# Patient Record
Sex: Male | Born: 1960 | Race: White | Hispanic: No | Marital: Married | Smoking: Never smoker
Health system: Southern US, Community
[De-identification: ages and names within clinical notes are randomized; demographics above are authoritative.]

## PROBLEM LIST (undated history)

## (undated) DIAGNOSIS — E538 Deficiency of other specified B group vitamins: Secondary | ICD-10-CM

## (undated) DIAGNOSIS — E559 Vitamin D deficiency, unspecified: Secondary | ICD-10-CM

## (undated) DIAGNOSIS — E119 Type 2 diabetes mellitus without complications: Secondary | ICD-10-CM

## (undated) HISTORY — DX: Vitamin D deficiency, unspecified: E55.9

## (undated) HISTORY — DX: Type 2 diabetes mellitus without complications: E11.9

## (undated) HISTORY — PX: CHOLECYSTECTOMY: SHX55

## (undated) HISTORY — DX: Deficiency of other specified B group vitamins: E53.8

---

## 2006-12-28 ENCOUNTER — Ambulatory Visit: Payer: Self-pay | Admitting: Internal Medicine

## 2007-01-10 ENCOUNTER — Ambulatory Visit: Payer: Self-pay | Admitting: Internal Medicine

## 2007-01-27 ENCOUNTER — Ambulatory Visit: Payer: Self-pay | Admitting: Internal Medicine

## 2007-02-27 ENCOUNTER — Ambulatory Visit: Payer: Self-pay | Admitting: Internal Medicine

## 2007-03-30 ENCOUNTER — Ambulatory Visit: Payer: Self-pay | Admitting: Internal Medicine

## 2007-04-27 ENCOUNTER — Ambulatory Visit: Payer: Self-pay | Admitting: Internal Medicine

## 2007-05-28 ENCOUNTER — Ambulatory Visit: Payer: Self-pay | Admitting: Internal Medicine

## 2007-07-28 ENCOUNTER — Ambulatory Visit: Payer: Self-pay | Admitting: Internal Medicine

## 2007-08-08 ENCOUNTER — Ambulatory Visit: Payer: Self-pay | Admitting: Internal Medicine

## 2007-08-27 ENCOUNTER — Ambulatory Visit: Payer: Self-pay | Admitting: Internal Medicine

## 2007-10-17 ENCOUNTER — Ambulatory Visit: Payer: Self-pay | Admitting: Internal Medicine

## 2007-10-28 ENCOUNTER — Ambulatory Visit: Payer: Self-pay | Admitting: Internal Medicine

## 2007-11-27 ENCOUNTER — Ambulatory Visit: Payer: Self-pay | Admitting: Internal Medicine

## 2007-12-28 ENCOUNTER — Ambulatory Visit: Payer: Self-pay | Admitting: Internal Medicine

## 2008-01-15 ENCOUNTER — Ambulatory Visit: Payer: Self-pay | Admitting: Internal Medicine

## 2008-01-27 ENCOUNTER — Ambulatory Visit: Payer: Self-pay | Admitting: Internal Medicine

## 2008-02-27 ENCOUNTER — Ambulatory Visit: Payer: Self-pay | Admitting: Internal Medicine

## 2008-07-27 ENCOUNTER — Ambulatory Visit: Payer: Self-pay | Admitting: Internal Medicine

## 2008-08-17 ENCOUNTER — Ambulatory Visit: Payer: Self-pay | Admitting: Internal Medicine

## 2008-08-26 ENCOUNTER — Ambulatory Visit: Payer: Self-pay | Admitting: Internal Medicine

## 2008-10-27 ENCOUNTER — Ambulatory Visit: Payer: Self-pay | Admitting: Internal Medicine

## 2008-11-16 ENCOUNTER — Ambulatory Visit: Payer: Self-pay | Admitting: Internal Medicine

## 2008-11-26 ENCOUNTER — Ambulatory Visit: Payer: Self-pay | Admitting: Internal Medicine

## 2008-12-31 ENCOUNTER — Ambulatory Visit: Payer: Self-pay | Admitting: General Surgery

## 2009-01-06 ENCOUNTER — Ambulatory Visit: Payer: Self-pay | Admitting: General Surgery

## 2009-02-26 ENCOUNTER — Ambulatory Visit: Payer: Self-pay | Admitting: Internal Medicine

## 2009-03-01 ENCOUNTER — Ambulatory Visit: Payer: Self-pay | Admitting: Internal Medicine

## 2009-03-02 ENCOUNTER — Ambulatory Visit: Payer: Self-pay | Admitting: Internal Medicine

## 2009-03-29 ENCOUNTER — Ambulatory Visit: Payer: Self-pay | Admitting: Internal Medicine

## 2009-07-18 ENCOUNTER — Ambulatory Visit: Payer: Self-pay | Admitting: Internal Medicine

## 2009-07-27 ENCOUNTER — Ambulatory Visit: Payer: Self-pay | Admitting: Internal Medicine

## 2009-08-25 ENCOUNTER — Ambulatory Visit: Payer: Self-pay | Admitting: Internal Medicine

## 2009-08-26 ENCOUNTER — Ambulatory Visit: Payer: Self-pay | Admitting: Internal Medicine

## 2010-03-01 ENCOUNTER — Ambulatory Visit: Payer: Self-pay | Admitting: Internal Medicine

## 2010-03-03 ENCOUNTER — Ambulatory Visit: Payer: Self-pay | Admitting: Internal Medicine

## 2010-03-29 ENCOUNTER — Ambulatory Visit: Payer: Self-pay | Admitting: Internal Medicine

## 2010-09-01 ENCOUNTER — Ambulatory Visit: Payer: Self-pay | Admitting: Internal Medicine

## 2010-09-27 ENCOUNTER — Ambulatory Visit: Payer: Self-pay | Admitting: Internal Medicine

## 2011-03-02 ENCOUNTER — Ambulatory Visit: Payer: Self-pay | Admitting: Internal Medicine

## 2011-03-06 ENCOUNTER — Ambulatory Visit: Payer: Self-pay | Admitting: Internal Medicine

## 2011-03-06 LAB — CBC CANCER CENTER
Basophil %: 0.5 %
Eosinophil #: 0.1 x10 3/mm (ref 0.0–0.7)
HCT: 41.5 % (ref 40.0–52.0)
Lymphocyte %: 24.6 %
MCH: 29.9 pg (ref 26.0–34.0)
MCHC: 34.9 g/dL (ref 32.0–36.0)
Monocyte #: 0.4 x10 3/mm (ref 0.0–0.7)
Neutrophil #: 4.2 x10 3/mm (ref 1.4–6.5)
Neutrophil %: 67.7 %
Platelet: 125 x10 3/mm — ABNORMAL LOW (ref 150–440)
RDW: 13.9 % (ref 11.5–14.5)
WBC: 6.2 x10 3/mm (ref 3.8–10.6)

## 2011-03-06 LAB — LACTATE DEHYDROGENASE: LDH: 171 U/L (ref 87–241)

## 2011-03-30 ENCOUNTER — Ambulatory Visit: Payer: Self-pay | Admitting: Internal Medicine

## 2011-09-11 ENCOUNTER — Ambulatory Visit: Payer: Self-pay | Admitting: Oncology

## 2011-09-27 ENCOUNTER — Ambulatory Visit: Payer: Self-pay | Admitting: Oncology

## 2012-09-11 ENCOUNTER — Ambulatory Visit: Payer: Self-pay | Admitting: Oncology

## 2012-09-26 ENCOUNTER — Ambulatory Visit: Payer: Self-pay | Admitting: Oncology

## 2012-12-12 ENCOUNTER — Ambulatory Visit: Payer: Self-pay | Admitting: Oncology

## 2012-12-27 ENCOUNTER — Ambulatory Visit: Payer: Self-pay | Admitting: Oncology

## 2013-06-22 ENCOUNTER — Ambulatory Visit: Payer: Self-pay | Admitting: Oncology

## 2013-08-25 DIAGNOSIS — E782 Mixed hyperlipidemia: Secondary | ICD-10-CM | POA: Insufficient documentation

## 2013-08-25 DIAGNOSIS — E538 Deficiency of other specified B group vitamins: Secondary | ICD-10-CM | POA: Insufficient documentation

## 2013-08-25 DIAGNOSIS — E559 Vitamin D deficiency, unspecified: Secondary | ICD-10-CM | POA: Insufficient documentation

## 2013-12-23 ENCOUNTER — Ambulatory Visit: Payer: Self-pay | Admitting: Oncology

## 2013-12-29 DIAGNOSIS — E119 Type 2 diabetes mellitus without complications: Secondary | ICD-10-CM | POA: Insufficient documentation

## 2013-12-30 ENCOUNTER — Ambulatory Visit: Payer: Self-pay | Admitting: Oncology

## 2014-01-26 ENCOUNTER — Ambulatory Visit: Payer: Self-pay | Admitting: Oncology

## 2014-06-11 ENCOUNTER — Other Ambulatory Visit: Payer: Self-pay | Admitting: Oncology

## 2014-06-11 DIAGNOSIS — R161 Splenomegaly, not elsewhere classified: Secondary | ICD-10-CM

## 2014-10-06 ENCOUNTER — Other Ambulatory Visit: Payer: Self-pay | Admitting: Oncology

## 2014-12-30 ENCOUNTER — Telehealth: Payer: Self-pay | Admitting: Oncology

## 2014-12-31 ENCOUNTER — Telehealth: Payer: Self-pay | Admitting: *Deleted

## 2014-12-31 NOTE — Telephone Encounter (Signed)
Asking an order be faxed to [facility] [phone] for labs he needs drawn for his upcoming appt

## 2014-12-31 NOTE — Telephone Encounter (Signed)
Order faxed.

## 2015-01-12 ENCOUNTER — Ambulatory Visit: Payer: Self-pay | Admitting: Oncology

## 2015-01-14 ENCOUNTER — Other Ambulatory Visit: Payer: Self-pay | Admitting: *Deleted

## 2015-01-14 DIAGNOSIS — R161 Splenomegaly, not elsewhere classified: Secondary | ICD-10-CM

## 2015-01-17 ENCOUNTER — Ambulatory Visit: Payer: BLUE CROSS/BLUE SHIELD

## 2015-01-17 ENCOUNTER — Ambulatory Visit
Admission: RE | Admit: 2015-01-17 | Discharge: 2015-01-17 | Disposition: A | Discharge status: Home / Self Care | Payer: BLUE CROSS/BLUE SHIELD | Source: Ambulatory Visit | Attending: Family Medicine | Admitting: Family Medicine

## 2015-01-17 DIAGNOSIS — R161 Splenomegaly, not elsewhere classified: Secondary | ICD-10-CM | POA: Diagnosis not present

## 2015-01-24 ENCOUNTER — Encounter: Payer: Self-pay | Admitting: Oncology

## 2015-01-24 ENCOUNTER — Inpatient Hospital Stay: Payer: BLUE CROSS/BLUE SHIELD | Attending: Oncology | Admitting: Oncology

## 2015-01-24 VITALS — BP 137/89 | HR 91 | Temp 96.8°F | Wt 211.2 lb

## 2015-01-24 DIAGNOSIS — E119 Type 2 diabetes mellitus without complications: Secondary | ICD-10-CM | POA: Diagnosis not present

## 2015-01-24 DIAGNOSIS — R531 Weakness: Secondary | ICD-10-CM | POA: Insufficient documentation

## 2015-01-24 DIAGNOSIS — R160 Hepatomegaly, not elsewhere classified: Secondary | ICD-10-CM

## 2015-01-24 DIAGNOSIS — R5383 Other fatigue: Secondary | ICD-10-CM | POA: Diagnosis not present

## 2015-01-24 DIAGNOSIS — Z87891 Personal history of nicotine dependence: Secondary | ICD-10-CM | POA: Insufficient documentation

## 2015-01-24 DIAGNOSIS — K760 Fatty (change of) liver, not elsewhere classified: Secondary | ICD-10-CM | POA: Diagnosis not present

## 2015-01-24 DIAGNOSIS — Z794 Long term (current) use of insulin: Secondary | ICD-10-CM | POA: Diagnosis not present

## 2015-01-24 DIAGNOSIS — R161 Splenomegaly, not elsewhere classified: Secondary | ICD-10-CM | POA: Diagnosis not present

## 2015-01-24 DIAGNOSIS — D580 Hereditary spherocytosis: Secondary | ICD-10-CM | POA: Diagnosis not present

## 2015-01-24 MED ORDER — CYANOCOBALAMIN ER 1000 MCG PO TBCR: 1.0000 | EXTENDED_RELEASE_TABLET | Freq: Two times a day (BID) | ORAL | Status: AC

## 2015-01-24 MED ORDER — VITAMIN D3 25 MCG (1000 UT) PO CAPS: 1.0000 | ORAL_CAPSULE | Freq: Two times a day (BID) | ORAL | Status: AC

## 2015-01-24 NOTE — Progress Notes (Signed)
Patient here today for one year follow up and US results.  Patient complains of fatigue.  States he has a cold at this time.

## 2015-01-24 NOTE — Progress Notes (Signed)
[facility] @ [facility] Telephone:(336) [phone]  Fax:(336) [phone]     Gary Axononald D Naugle Jr. OB: 10/01/1960  MR#: [id number]  AOZ#:[id number]#:[id number]  Patient Care Team: Jerl MinaJames Hedrick, MD as PCP - General (Family Medicine)  CHIEF COMPLAINT: No chief complaint on file.    No history exists.    Herediatary Spherocytosis 1. Thrombocytopenia. 2. Ultrasound of abdomen general survey on 01-14-07. Cholelithiasis, probable fatty infiltration of liver, splenomegaly  3. CT abdomen and pelvis with contrast 01-24-07. Borderline splenomegaly. The portal vein is patent. No varices are noted. No evidence of retroperitoneal adenopathy. 4. Labs 01-10-07. HIV negative, HCV antibody negative, hepatitis-B antigen and antibody negative, ANA 42 negative, folic acid and vitamin B12 within normal limits, SPEP normal IFE pattern. Normal levels of Immunoglobulins. Total bilirubin 2.3, direct bilirubin 0.2, LDH 143 Coombs direct and indirect negative. 5. Labs 02-10-07. Platelet antibody, direct negative, platelet antibody indirect negative, super panel EBV acute antibody. EBV early antigen antibody, IgG 164, EBV antibody VCA, IgG 2507, EBV nuclear antigen antibody, IgG 458, EBV antibody VCA, IgM  6.  PNH screen 02-13-07. No flow cytometric evidence of PNH.   7. Ultrasound of groin 04-01-07. Four discrete lymph nodes on left with maximal size 2.0 cm. 8. Haptoglobin < 1 9. 11/24/07: osmotic fragility  and patient's red blood cells show slightly increased osmotic fragility.  Peripheral blood smear reveals rare spherocytes. 10/ 03/01/09: ultrasound spleen:Persistent splenomegaly, stable as compared to a prior exam of February 16, 2008 INTERVAL HISTORY: Patient is here for further follow-up regarding splenomegaly.  Patient has insulin-dependent diabetes.  Had ultrasound of abdomen which revealed normal size of the spleen. Patient continues to have intermittent weakness.  Blood sugar is very high at 361.  Diabetes being managed by  endocrinologist REVIEW OF SYSTEMS:    general status: Patient is feeling intermittent weakness  and tired.  No change in a performance status.  No chills.  No fever. HEENT: Alopecia.  No evidence of stomatitis Lungs: No cough or shortness of breath Cardiac: No chest pain or paroxysmal nocturnal dyspnea GI: No nausea no vomiting no diarrhea no abdominal pain Skin: No rash Lower extremity no swelling Neurological system: No tingling.  No numbness.  No other focal signs Musculoskeletal system no bony pains  As per HPI. Otherwise, a complete review of systems is negatve.   ADVANCED DIRECTIVES:  Patient does have advance healthcare directive, Patient   does not desire to make any changes HEALTH MAINTENANCE: Social History  Substance Use Topics  . Smoking status: Not on file  . Smokeless tobacco: Not on file  . Alcohol Use: Not on file      Allergies not on file  Current Outpatient Prescriptions  Medication Sig Dispense Refill  . folic acid (FOLVITE) 1 MG tablet TAKE ONE TABLET BY MOUTH ONE TIME DAILY 100 tablet 2   No current facility-administered medications for this visit.   Lab data from the outside institution dated down January 03, 2015  Hemoglobin is 15.6 WBC count 7.2 Platelet count is 126, 000 Blood sugar is 361 Bilirubin is 2.5   OBJECTIVE:  There were no vitals filed for this visit.   There is no height or weight on file to calculate BMI.    ECOG FS:0 - Asymptomatic  PHYSICAL EXAM: General  status: Performance status is good.  Patient has not lost significant weight. Since last evaluation there is no significant change in the general status HEENT: No evidence of stomatitis. Sclera and conjunctivae :: No jaundice.   pale  looking. Lungs: Air  entry equal on both sides.  No rhonchi.  No rales.  Cardiac: Heart sounds are normal.  No pericardial rub.  No murmur. Lymphatic system: Cervical, axillary, inguinal, lymph nodes not palpable GI: Abdomen is soft.liver and  spleen not palpable.  No ascites.  Bowel sounds are normal.  No other palpable masses.  No tenderness . Lower extremity: No edema Neurological system: Higher functions, cranial nerves intact no evidence of peripheral neuropathy. Skin: No rash.  No ecchymosis.. No petechial hemorrhages   LAB RESULTS:     Significant History/PMH:   Hereditary Spherocytosis:    diabetes:    cholecystectomy: Nov 2010  Preventive Screening:  Has patient had any of the following test? Prostate Exam   Last Prostate Exam: 2014   Smoking History: Smoking History Packs per day  STUDIES: US Abdomen Limited  01/17/2015  CLINICAL DATA:  Splenomegaly, hereditary spherocytosis EXAM: LIMITED ABDOMINAL ULTRASOUND COMPARISON:  CT abdomen pelvis dated 12/23/2013 FINDINGS: Spleen is enlarged, measuring 15.0 x 15.6 x 6.0 cm (calculated volume 736 mL). When accounting for differences in technique, this is grossly unchanged from prior CT, when it previously measured 14.8 x 14.7 x 9.4 cm. IMPRESSION: Moderate splenomegaly, measuring up to 15.6 cm (calculated volume 736 mL). Electronically Signed   By: Charline Bills M.D.   On: 01/17/2015 08:25    ASSESSMENT: 1. 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 Magdalen Spatz) Stable splenomegaly. Thrombocytopenia hes  resolved Slightly evaluatedbilirubin:, stable abnormal blood sugar and hemoglobin A1c profile being followed by primary care physician Abnormal cholesterol values Plan:   1. Hereditary Spherocytosis. Stable hemoglobin, without any evidence of hemolysis. Thrombocytopenia stable, count 133,000 today.   MEDICAL DECISION MAKING:  Ultrasound of abdomen has been reviewed.\Intermittent tiredness may be related to diabetes. Ultrasound does not show any evidence of progressive splenomegaly Hemoglobin is stable Bilirubin is slightly elevated may be a combination of fatty liver and mild hemolysis Patient had a flu vaccine  Patient expressed  understanding and was in agreement with this plan. He also understands that He can call clinic at any time with any questions, concerns, or complaints.    No matching staging information was found for the patient.  Johney Maine, MD   01/24/2015 8:29 AM

## 2015-01-24 NOTE — Addendum Note (Signed)
Addended by: Glory BuffHODE, Yamaris Cummings on: 01/24/2015 09:10 AM   Modules accepted: Orders

## 2015-09-27 ENCOUNTER — Other Ambulatory Visit: Payer: Self-pay | Admitting: Oncology

## 2016-01-13 ENCOUNTER — Encounter: Payer: Self-pay | Admitting: Internal Medicine

## 2016-01-16 ENCOUNTER — Telehealth: Payer: Self-pay | Admitting: *Deleted

## 2016-01-16 NOTE — Telephone Encounter (Addendum)
[facility] for [facility]t to have labs drawn

## 2016-01-17 ENCOUNTER — Ambulatory Visit
Admission: RE | Admit: 2016-01-17 | Discharge: 2016-01-17 | Disposition: A | Discharge status: Home / Self Care | Payer: BLUE CROSS/BLUE SHIELD | Source: Ambulatory Visit | Attending: Oncology | Admitting: Oncology

## 2016-01-17 DIAGNOSIS — R932 Abnormal findings on diagnostic imaging of liver and biliary tract: Secondary | ICD-10-CM | POA: Diagnosis not present

## 2016-01-17 DIAGNOSIS — R161 Splenomegaly, not elsewhere classified: Secondary | ICD-10-CM

## 2016-01-17 DIAGNOSIS — R160 Hepatomegaly, not elsewhere classified: Secondary | ICD-10-CM | POA: Diagnosis present

## 2016-01-23 ENCOUNTER — Ambulatory Visit: Payer: BLUE CROSS/BLUE SHIELD | Admitting: Oncology

## 2016-01-23 ENCOUNTER — Inpatient Hospital Stay: Payer: BLUE CROSS/BLUE SHIELD | Attending: Internal Medicine | Admitting: Internal Medicine

## 2016-01-23 DIAGNOSIS — K760 Fatty (change of) liver, not elsewhere classified: Secondary | ICD-10-CM | POA: Diagnosis not present

## 2016-01-23 DIAGNOSIS — Z9641 Presence of insulin pump (external) (internal): Secondary | ICD-10-CM | POA: Insufficient documentation

## 2016-01-23 DIAGNOSIS — E119 Type 2 diabetes mellitus without complications: Secondary | ICD-10-CM | POA: Diagnosis not present

## 2016-01-23 DIAGNOSIS — D580 Hereditary spherocytosis: Secondary | ICD-10-CM | POA: Insufficient documentation

## 2016-01-23 DIAGNOSIS — R161 Splenomegaly, not elsewhere classified: Secondary | ICD-10-CM | POA: Insufficient documentation

## 2016-01-23 DIAGNOSIS — Z794 Long term (current) use of insulin: Secondary | ICD-10-CM | POA: Diagnosis not present

## 2016-01-23 DIAGNOSIS — Z79899 Other long term (current) drug therapy: Secondary | ICD-10-CM

## 2016-01-23 DIAGNOSIS — D696 Thrombocytopenia, unspecified: Secondary | ICD-10-CM | POA: Insufficient documentation

## 2016-01-23 NOTE — Progress Notes (Signed)
[place] OFFICE PROGRESS NOTE  Patient Care Team: [place] Pink, MD as PCP - General (Family Medicine)  No matching staging information was found for the patient.  # 2008- Herediatary Spherocytosis- 2008/ [Dr.Yabanez]; eval at [place] [ Haptoglobin < 1;  11/24/07: osmotic fragility  and patient's red blood cells show slightly increased osmotic fragility.  Peripheral blood smear reveals rare spherocytes. 4. Labs 01-10-07. HIV negative, HCV antibody negative, hepatitis-B antigen and antibody negative, ANA 42 negative, folic acid and vitamin B12 within normal limits, SPEP normal IFE pattern. Normal levels of Immunoglobulins. Total bilirubin 2.3, direct bilirubin 0.2, LDH 143 Coombs direct and indirect negative. 6.  PNH screen 02-13-07. No flow cytometric evidence of PNH ?? Molecular testing for Hereditary spherocytosis.   #Thrombocytopenia- 2. Ultrasound of abdomen general survey on 01-14-07. Cholelithiasis, probable fatty infiltration of liver, splenomegaly ;  CT abdomen and pelvis with contrast 01-24-07. Borderline splenomegaly. The portal vein is patent. No varices are noted. No evidence of retroperitoneal adenopathy.  # Splenomegaly/ fatty liver- 10/ 03/01/09: ultrasound spleen:Persistent splenomegaly, stable as compared to a prior exam of February 16, 2008; 2017 NOV [place]- Splenomeglay- slightly increased volume to sep 2016.   # IDDM-  .    No history exists.     This is my first interaction with the patient as patient's primary oncologist has been Dr.Choksi. I reviewed the patient's prior charts/pertinent labs/imaging in detail; findings are summarized above.     INTERVAL HISTORY:  Gary Garcia. 55 y.o.  male pleasant patient above history of Spherocytosis; splenomegaly and also thrombocytopenia is here for follow-up.   Patient denies any admission the hospital for any acute issues recently. His appetite is good. He denies any left upper quadrant pain. Denies any weight  loss. Denies any early satiety. Energy levels are adequate.  REVIEW OF SYSTEMS:  A complete 10 point review of system is done which is negative except mentioned above/history of present illness.   PAST MEDICAL HISTORY : No past medical history on file.  PAST SURGICAL HISTORY :  No past surgical history on file.  FAMILY HISTORY :  No family history on file.  SOCIAL HISTORY:   Social History  Substance Use Topics  . Smoking status: Never Smoker  . Smokeless tobacco: Not on file  . Alcohol use Not on file    ALLERGIES:  has No Known Allergies.  MEDICATIONS:  Current Outpatient Prescriptions  Medication Sig Dispense Refill  . Cholecalciferol (VITAMIN D3) 1000 UNITS CAPS Take 1 capsule (1,000 Units total) by mouth 2 (two) times daily. 60 capsule   . Cyanocobalamin (RA VITAMIN B-12 TR) 1000 MCG TBCR Take 1 tablet (1,000 mcg total) by mouth 2 (two) times daily.    . folic acid (FOLVITE) 1 MG tablet TAKE ONE TABLET BY MOUTH ONE TIME DAILY 100 tablet 1  . Insulin Disposable Pump (V-GO 40) KIT USE AS DIRECTED.    Marland Kitchen insulin glargine (LANTUS) 100 UNIT/ML injection Inject 26 units qam along with humalog kwikpen 6-8 units qac on off pump day    . Insulin Pen Needle (BD PEN NEEDLE NANO U/F) 32G X 4 MM MISC USE ONE PEN NEEDLE FOUR TIMES A DAY AS DIRECTED WITH NOVOLOG AND LANTUS PENS    . metFORMIN (GLUCOPHAGE) 1000 MG tablet TAKE ONE TABLET BY MOUTH TWICE A DAY AS DIRECTED    . NOVOLOG 100 UNIT/ML injection   2  . omega-3 acid ethyl esters (LOVAZA) 1 G capsule Take by mouth.    Marland Kitchen  ONE TOUCH ULTRA TEST test strip USE FOUR TIMES A DAY AS DIRECTED  1   No current facility-administered medications for this visit.     PHYSICAL EXAMINATION: ECOG PERFORMANCE STATUS: 0 - Asymptomatic  BP (!) 141/87 (BP Location: Left Arm, Patient Position: Sitting)   Pulse 76   Temp (!) 96.9 F (36.1 C) (Tympanic)   Wt 217 lb 4 oz (98.5 kg)   Filed Weights   01/23/16 1123  Weight: 217 lb 4 oz (98.5 kg)     GENERAL: Well-nourished well-developed; Alert, no distress and comfortaAlone.EYES: no pallor or icterus OROPHARYNX: no thrush or ulceration; good dentition  NECK: supple, no masses felt LYMPH:  no palpable lymphadenopathy in the cervical, axillary or inguinal regions LUNGS: clear to auscultation and  No wheeze or crackles HEART/CVS: regular rate & rhythm and no murmurs; No lower extremity edema ABDOMEN:abdomen soft, non-tender and normal bowel sounds; mild splenomegaly noted on exam.  Musculoskeletal:no cyanosis of digits and no clubbing  PSYCH: alert & oriented x 3 with fluent speech NEURO: no focal motor/sensory deficits SKIN:  no rashes or significant lesions  LABORATORY DATA:  I have reviewed the data as listed No results found for: NA, K, CL, CO2, GLUCOSE, BUN, CREATININE, CALCIUM, PROT, ALBUMIN, AST, ALT, ALKPHOS, BILITOT, GFRNONAA, GFRAA  No results found for: SPEP, UPEP  Lab Results  Component Value Date   WBC 6.2 03/06/2011   NEUTROABS 4.2 03/06/2011   HGB 14.5 03/06/2011   HCT 41.5 03/06/2011   MCV 86 03/06/2011   PLT 125 (L) 03/06/2011      Chemistry   No results found for: NA, K, CL, CO2, BUN, CREATININE, GLU No results found for: CALCIUM, ALKPHOS, AST, ALT, BILITOT     RADIOGRAPHIC STUDIES: I have personally reviewed the radiological images as listed and agreed with the findings in the report. No results found.   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 Patient on folic acid. No crisis.  # Splenomegaly/ slight increase in volume over the last 1 year. However not symptomatic. Repeat ultrasound in one year. Discuss the need for splenectomy if patient gets symptomatic.  # Thrombocytopenia- platelets 143/ intermittent- question splenomegaly versus others. Monitor for now.  # Follow up in 1 year/ US spleen/ labs  [[facility]] 1  week prior.    Orders Placed This Encounter  Procedures  . US Abdomen Limited    Standing Status:   Future    Standing Expiration Date:   07/22/2017    Order Specific Question:   Reason for Exam (SYMPTOM  OR DIAGNOSIS REQUIRED)    Answer:   Thrombocytopenia; spleenomegally    Order Specific Question:   Preferred imaging location?    Answer:   [facility]   All questions were answered. The patient knows to call the clinic with any problems, questions or concerns.      Cammie Sickle, MD 01/24/2016 11:52 AM

## 2016-01-23 NOTE — Progress Notes (Signed)
Patient here today to follow up on Splenomegaly.  Patient states no new concerns today

## 2016-01-23 NOTE — Assessment & Plan Note (Signed)
#   Hereditary spherecytosis- [[facility]; interestingly no family history]; hemoglobin stable at 14 [[facility]]. Patient on folic acid. No crisis.  # Splenomegaly/ slight increase in volume over the last 1 year. However not symptomatic. Repeat ultrasound in one year. Discuss the need for splenectomy if patient gets symptomatic.  # Thrombocytopenia- platelets 143/ intermittent- question splenomegaly versus others. Monitor for now.  # Follow up in 1 year/ US spleen/ labs  [[facility]] 1 week prior.

## 2016-04-25 ENCOUNTER — Other Ambulatory Visit: Payer: Self-pay | Admitting: Internal Medicine

## 2016-05-31 IMAGING — US US ABDOMEN LIMITED
1 series · 10 of 10 positions shown · non-contrast
Comparison: CT abdomen pelvis dated 12/23/2013

CLINICAL DATA: Splenomegaly, hereditary spherocytosis

EXAM:
LIMITED ABDOMINAL ULTRASOUND

[Series 1: us abdomen limited · 0.31mm/px · 10 of 10 slices shown]
[im 1/10]
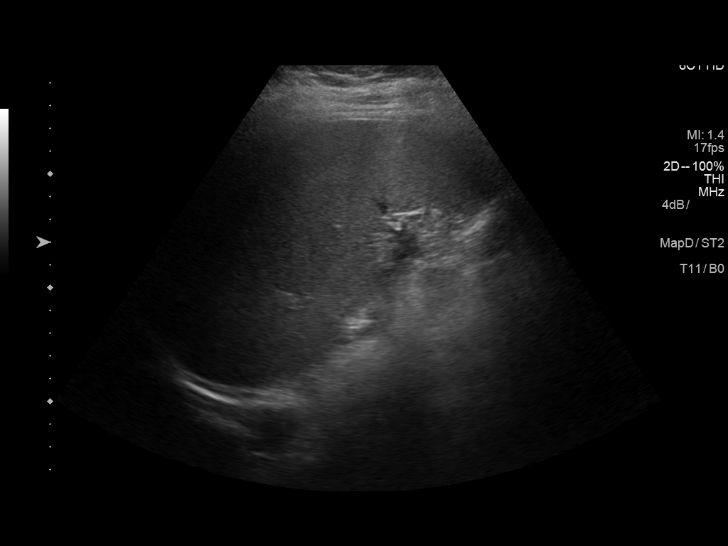
[im 2/10]
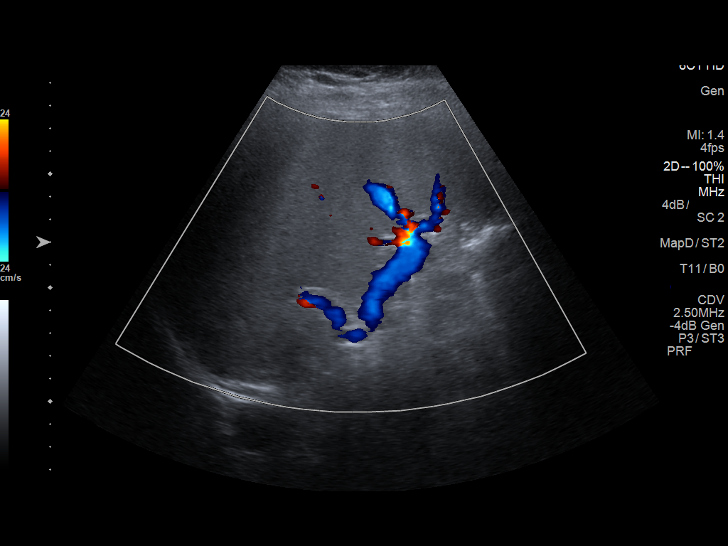
[im 3/10]
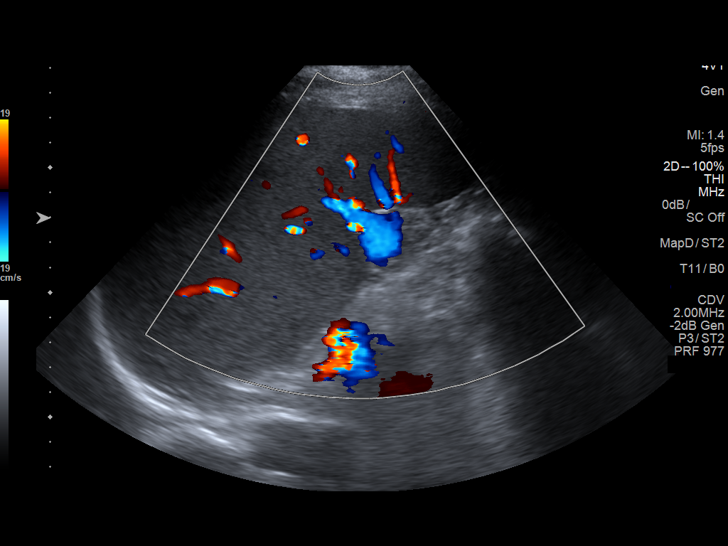
[im 4/10]
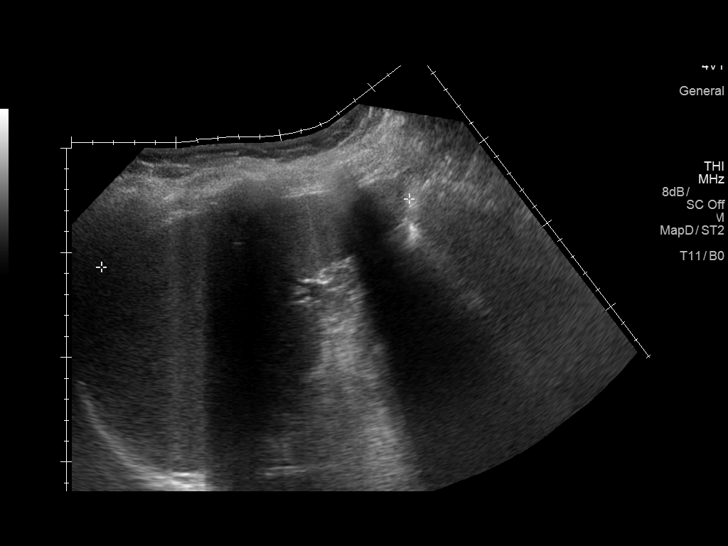
[im 5/10]
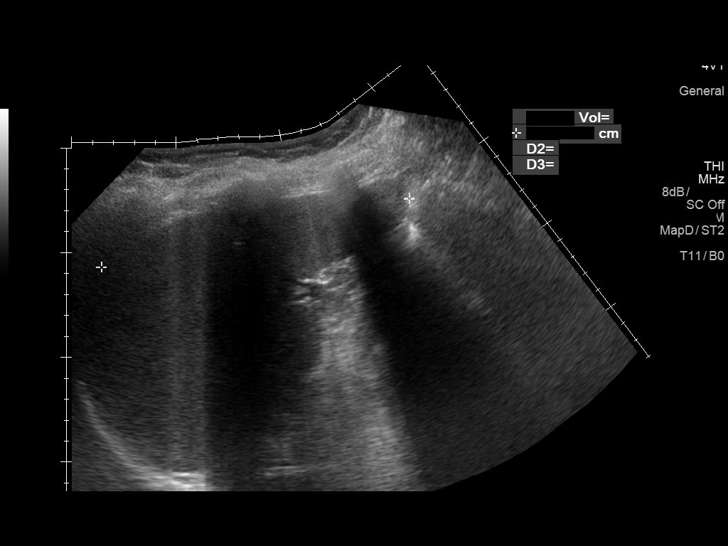
[im 6/10]
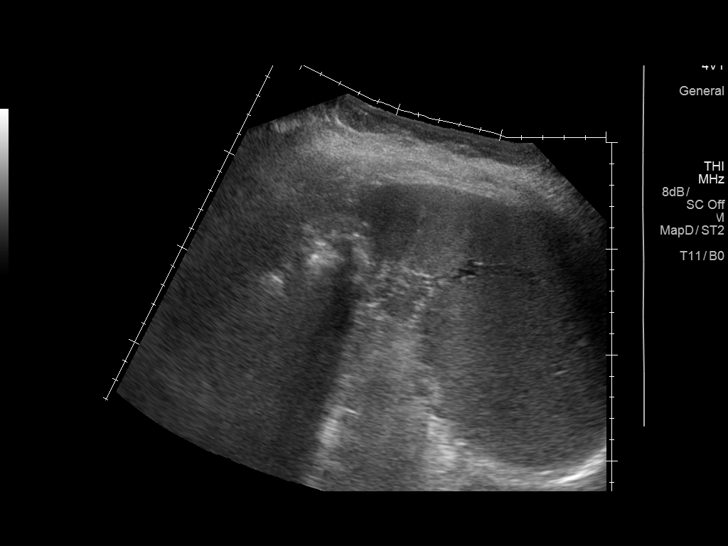
[im 7/10]
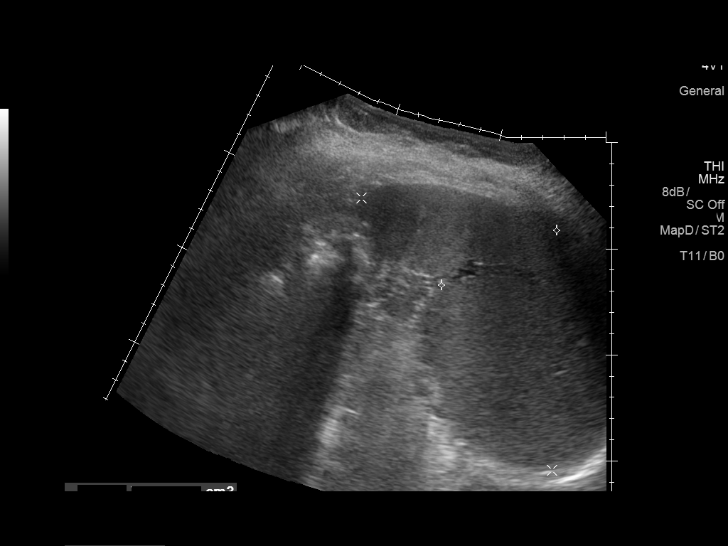
[im 8/10]
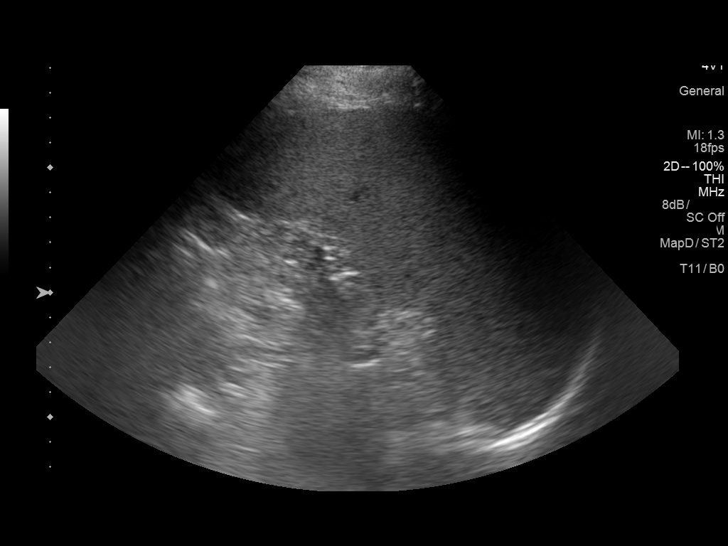
[im 9/10]
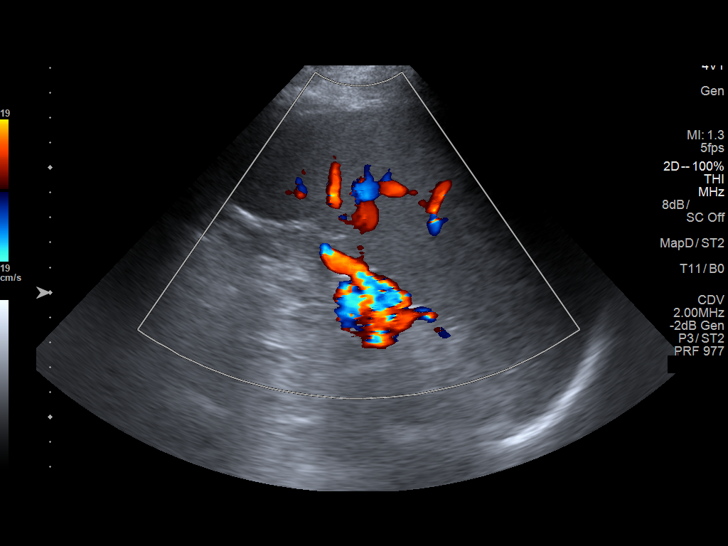
[im 10/10]
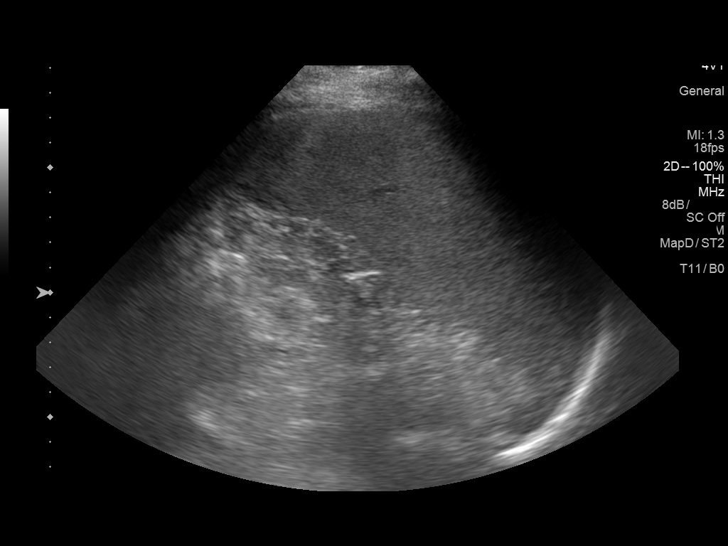

[10 of 10 positions shown; findings below may reference images not displayed]

FINDINGS: Spleen is enlarged, measuring 15.0 x 15.6 x 6.0 cm (calculated
volume 736 mL).

When accounting for differences in technique, this is grossly
unchanged from prior CT, when it previously measured 14.8 x 14.7 x
9.4 cm.
IMPRESSION: Moderate splenomegaly, measuring up to 15.6 cm (calculated volume
736 mL).

## 2016-06-14 ENCOUNTER — Other Ambulatory Visit: Payer: Self-pay

## 2016-06-14 DIAGNOSIS — E559 Vitamin D deficiency, unspecified: Secondary | ICD-10-CM

## 2016-06-14 DIAGNOSIS — E1165 Type 2 diabetes mellitus with hyperglycemia: Secondary | ICD-10-CM

## 2016-06-14 DIAGNOSIS — E538 Deficiency of other specified B group vitamins: Secondary | ICD-10-CM

## 2016-06-14 DIAGNOSIS — Z794 Long term (current) use of insulin: Secondary | ICD-10-CM

## 2016-06-15 LAB — LIPID PANEL
CHOL/HDL RATIO: 5.7 ratio — AB (ref 0.0–5.0)
Cholesterol, Total: 154 mg/dL (ref 100–199)
HDL: 27 mg/dL — AB (ref >39)
LDL CALC: 81 mg/dL (ref 0–99)
TRIGLYCERIDES: 231 mg/dL — AB (ref 0–149)
VLDL CHOLESTEROL CAL: 46 mg/dL — AB (ref 5–40)

## 2016-06-15 LAB — CREATININE, SERUM
Creatinine, Ser: 0.85 mg/dL (ref 0.76–1.27)
GFR calc non Af Amer: 99 mL/min/{1.73_m2} (ref >59)
GFR, EST AFRICAN AMERICAN: 114 mL/min/{1.73_m2} (ref >59)

## 2016-06-15 LAB — B12 AND FOLATE PANEL: Vitamin B-12: 1352 pg/mL — ABNORMAL HIGH (ref 232–1245)

## 2016-06-15 LAB — MICROALBUMIN / CREATININE URINE RATIO
CREATININE, UR: 126 mg/dL
MICROALBUM., U, RANDOM: 4.5 ug/mL
Microalb/Creat Ratio: 3.6 mg/g creat (ref 0.0–30.0)

## 2016-06-15 LAB — HGB A1C W/O EAG: Hgb A1c MFr Bld: 7.4 % — ABNORMAL HIGH (ref 4.8–5.6)

## 2016-06-15 LAB — VITAMIN D 25 HYDROXY (VIT D DEFICIENCY, FRACTURES): Vit D, 25-Hydroxy: 30.6 ng/mL (ref 30.0–100.0)

## 2016-06-29 IMAGING — CT CT ABDOMEN W/ CM
3 of 5 series · 17 of 46 positions shown, 19 images · IV contrast (isovue)
Comparison: CT 12/23/2012

CLINICAL DATA: Evaluate splenomegaly.Hx of hereditary sperocytosis

EXAM:
CT ABDOMEN WITH CONTRAST
TECHNIQUE: Multidetector CT imaging of the abdomen was performed using the
standard protocol following bolus administration of intravenous
contrast.
CONTRAST:  100 mL Isovue

[Series 2: routine with · axial · 0.80mm/px · z∈[-944,-684]mm · 12 of 62 slices shown, 14 images]
[im 5/62  soft-tissue]
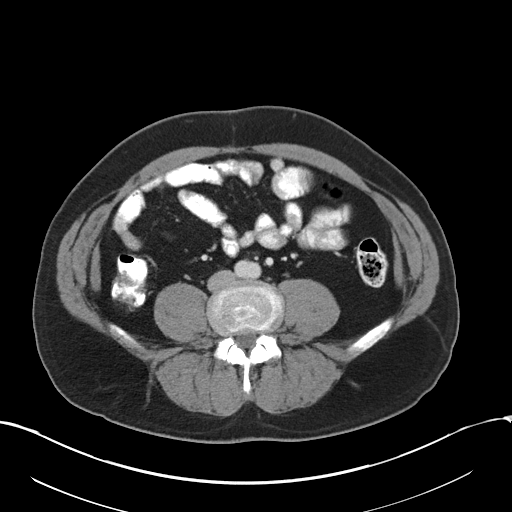
[im 5/62  bone]
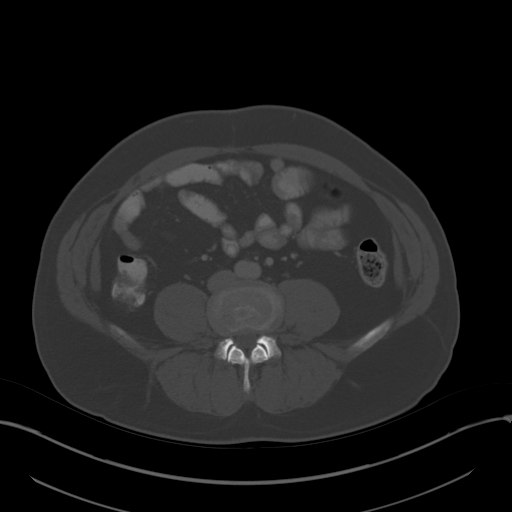
[im 10/62  soft-tissue]
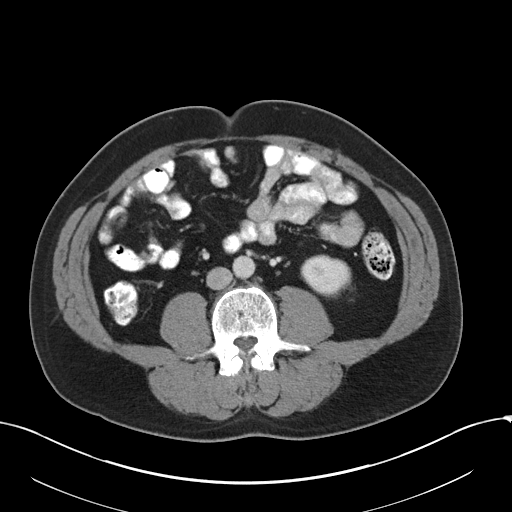
[im 15/62  soft-tissue]
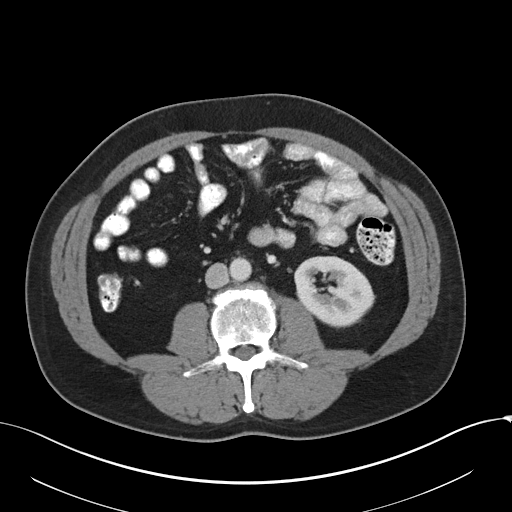
[im 19/62  soft-tissue]
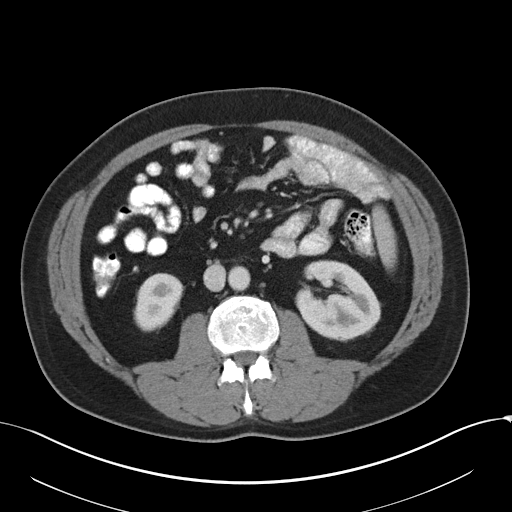
[im 24/62  soft-tissue]
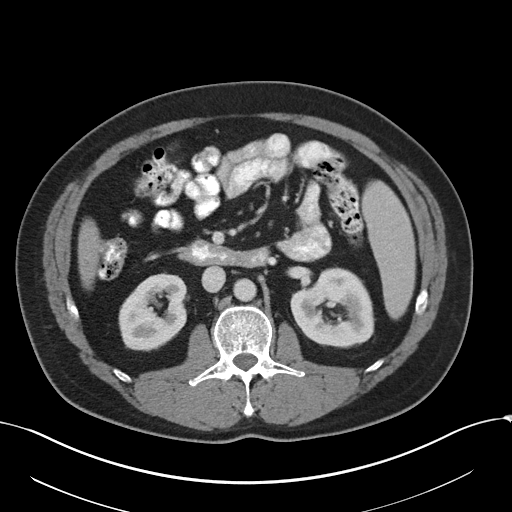
[im 29/62  soft-tissue]
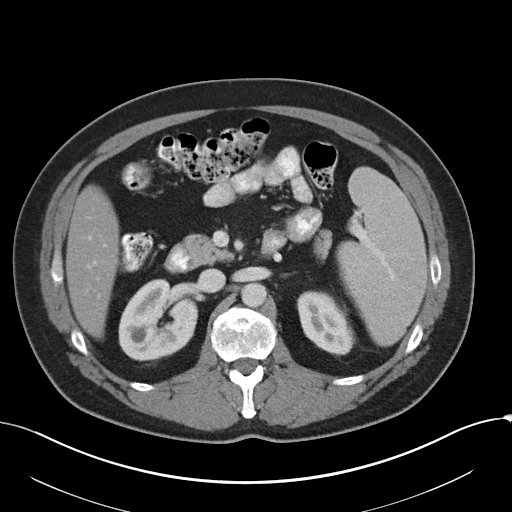
[im 33/62  soft-tissue]
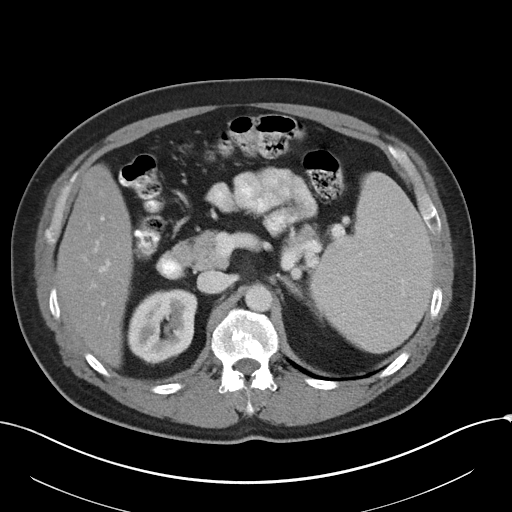
[im 38/62  soft-tissue]
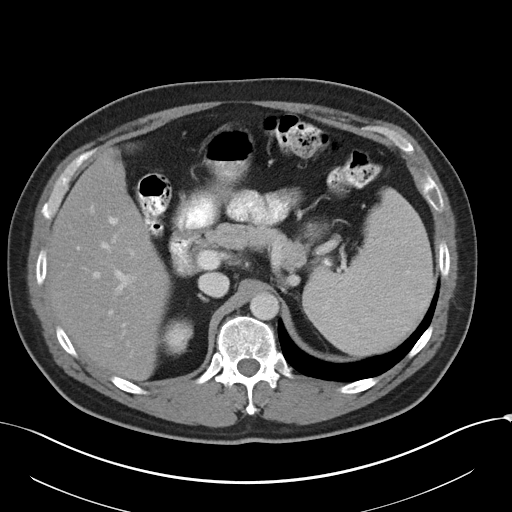
[im 43/62  soft-tissue]
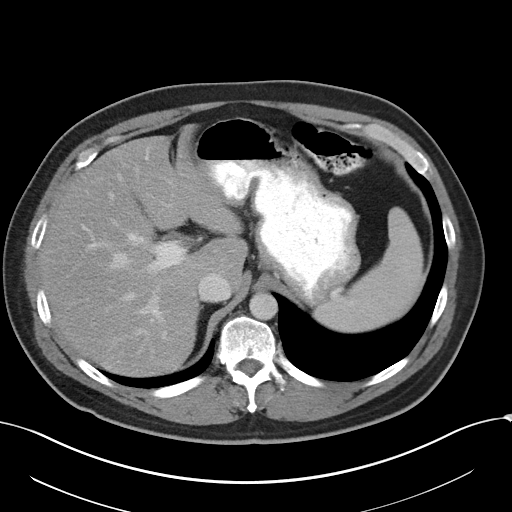
[im 43/62  bone]
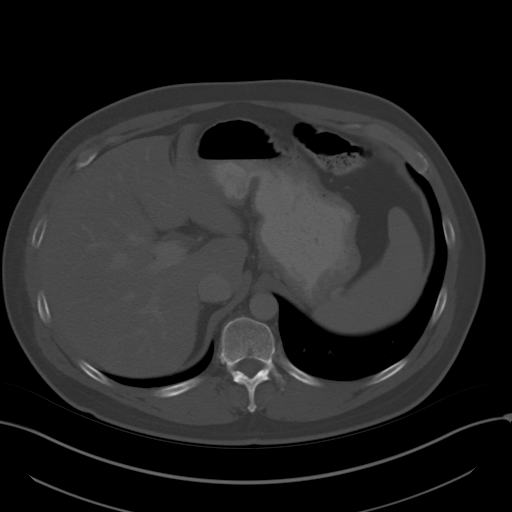
[im 47/62  soft-tissue]
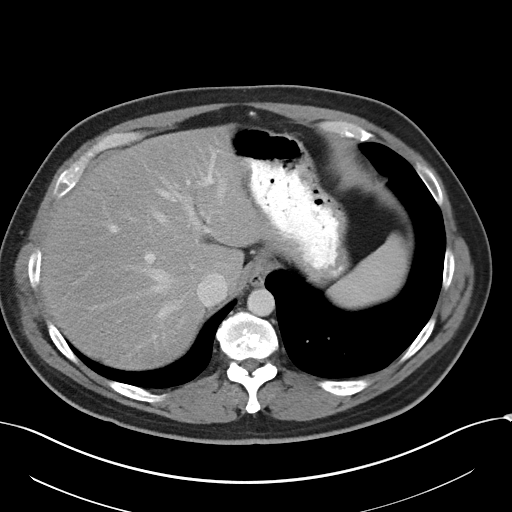
[im 52/62  soft-tissue]
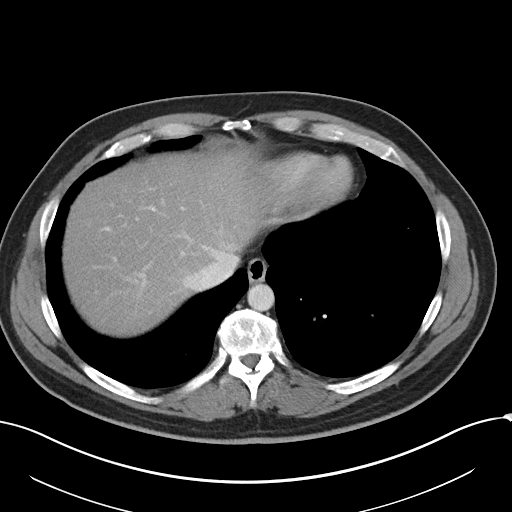
[im 57/62  soft-tissue]
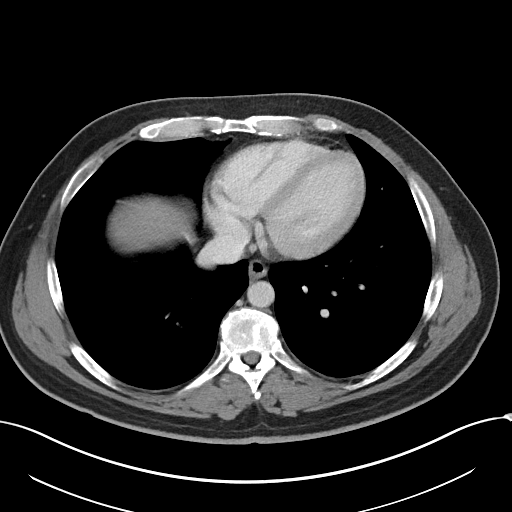

[Series 5: lung · axial · 0.80mm/px · z∈[-804,-779]mm · 2 of 34 slices shown]
[im 5/34  bone]
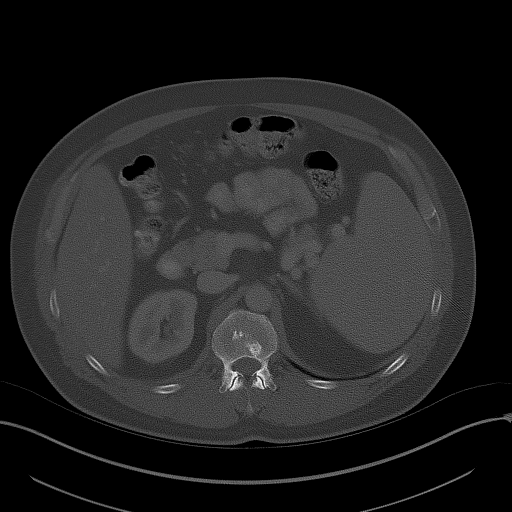
[im 10/34  bone]
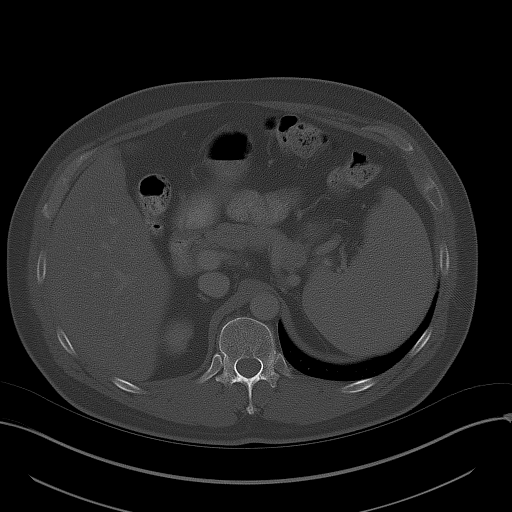

[Series 6: cor routine with · coronal · 0.61mm/px · 3 of 151 slices shown]
[im 51/151  soft-tissue]
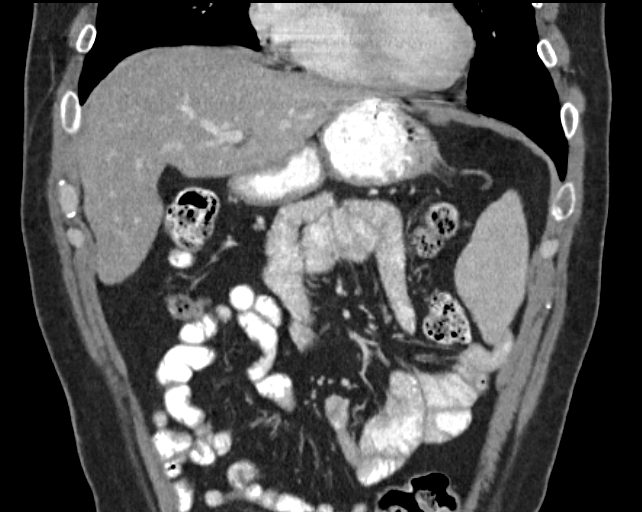
[im 67/151  soft-tissue]
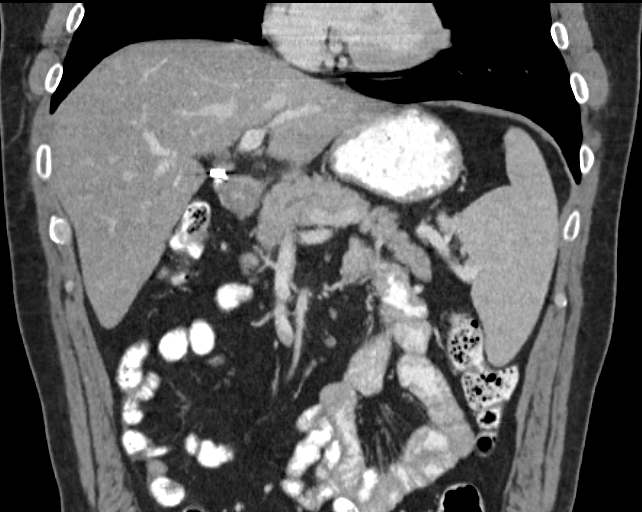
[im 84/151  soft-tissue]
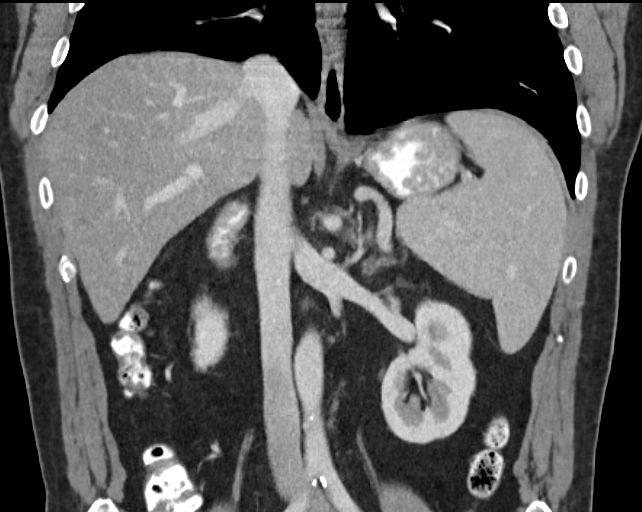

[17 of 46 positions shown; findings below may reference images not displayed]

FINDINGS: Lower chest:  Lung bases are clear.  No pericardial fluid.

Hepatobiliary: Focal hepatic lesion. Cholecystectomy anatomy. No
biliary duct dilatation.

Pancreas: Pancreas is normal. No duct dilatation. No pancreatic
inflammation.

Spleen: The spleen is enlarged calculated volume estimation of 1,056
cubic cm which is not changed significantly from comparison exam
(6430 cubic cm). This spleen measures 14.8 cm in craniocaudad
dimension.

Adrenals/urinary tract: Adrenal glands and kidneys are normal.
Proximal ureters are normal.

Stomach/Bowel: Stomach limited view of small bowel and colon are
unremarkable.

Vascular/Lymphatic: Abdominal aorta is normal caliber. There is no
retroperitoneal or periportal lymphadenopathy.

Musculoskeletal: No aggressive osseous lesion
IMPRESSION: Stable splenomegaly.

## 2016-09-07 ENCOUNTER — Other Ambulatory Visit: Payer: Self-pay

## 2016-09-07 DIAGNOSIS — E119 Type 2 diabetes mellitus without complications: Secondary | ICD-10-CM

## 2016-09-07 DIAGNOSIS — Z794 Long term (current) use of insulin: Secondary | ICD-10-CM

## 2016-09-07 DIAGNOSIS — Z125 Encounter for screening for malignant neoplasm of prostate: Secondary | ICD-10-CM

## 2016-09-08 LAB — PSA: PROSTATE SPECIFIC AG, SERUM: 0.8 ng/mL (ref 0.0–4.0)

## 2016-09-08 LAB — HGB A1C W/O EAG: Hgb A1c MFr Bld: 8.2 % — ABNORMAL HIGH (ref 4.8–5.6)

## 2017-01-06 ENCOUNTER — Other Ambulatory Visit: Payer: Self-pay | Admitting: Internal Medicine

## 2017-01-07 ENCOUNTER — Other Ambulatory Visit: Payer: Self-pay

## 2017-01-07 DIAGNOSIS — Z794 Long term (current) use of insulin: Secondary | ICD-10-CM

## 2017-01-07 DIAGNOSIS — E538 Deficiency of other specified B group vitamins: Secondary | ICD-10-CM

## 2017-01-07 DIAGNOSIS — E1165 Type 2 diabetes mellitus with hyperglycemia: Secondary | ICD-10-CM

## 2017-01-07 DIAGNOSIS — E559 Vitamin D deficiency, unspecified: Secondary | ICD-10-CM

## 2017-01-08 LAB — CREATININE, SERUM
Creatinine, Ser: 0.78 mg/dL (ref 0.76–1.27)
GFR calc Af Amer: 118 mL/min/{1.73_m2} (ref >59)
GFR calc non Af Amer: 102 mL/min/{1.73_m2} (ref >59)

## 2017-01-08 LAB — VITAMIN D 25 HYDROXY (VIT D DEFICIENCY, FRACTURES): Vit D, 25-Hydroxy: 29.3 ng/mL — ABNORMAL LOW (ref 30.0–100.0)

## 2017-01-08 LAB — LIPID PANEL
CHOL/HDL RATIO: 4.6 ratio (ref 0.0–5.0)
Cholesterol, Total: 157 mg/dL (ref 100–199)
HDL: 34 mg/dL — ABNORMAL LOW (ref >39)
LDL CALC: 104 mg/dL — AB (ref 0–99)
TRIGLYCERIDES: 97 mg/dL (ref 0–149)
VLDL Cholesterol Cal: 19 mg/dL (ref 5–40)

## 2017-01-08 LAB — HGB A1C W/O EAG: Hgb A1c MFr Bld: 7.4 % — ABNORMAL HIGH (ref 4.8–5.6)

## 2017-01-08 LAB — B12 AND FOLATE PANEL: Vitamin B-12: 1597 pg/mL — ABNORMAL HIGH (ref 232–1245)

## 2017-01-10 LAB — MICROALBUMIN / CREATININE URINE RATIO
Creatinine, Urine: 288.7 mg/dL
MICROALBUM., U, RANDOM: 16.1 ug/mL
Microalb/Creat Ratio: 5.6 mg/g creat (ref 0.0–30.0)

## 2017-01-21 ENCOUNTER — Ambulatory Visit: Payer: BLUE CROSS/BLUE SHIELD

## 2017-01-23 ENCOUNTER — Ambulatory Visit: Payer: BLUE CROSS/BLUE SHIELD | Admitting: Internal Medicine

## 2017-02-27 ENCOUNTER — Other Ambulatory Visit: Payer: Self-pay

## 2017-02-27 DIAGNOSIS — R161 Splenomegaly, not elsewhere classified: Secondary | ICD-10-CM

## 2017-02-28 ENCOUNTER — Ambulatory Visit
Admission: RE | Admit: 2017-02-28 | Discharge: 2017-02-28 | Disposition: A | Discharge status: Home / Self Care | Payer: BLUE CROSS/BLUE SHIELD | Source: Ambulatory Visit | Attending: Internal Medicine | Admitting: Internal Medicine

## 2017-02-28 DIAGNOSIS — R161 Splenomegaly, not elsewhere classified: Secondary | ICD-10-CM | POA: Insufficient documentation

## 2017-02-28 DIAGNOSIS — D696 Thrombocytopenia, unspecified: Secondary | ICD-10-CM | POA: Insufficient documentation

## 2017-02-28 DIAGNOSIS — D580 Hereditary spherocytosis: Secondary | ICD-10-CM | POA: Diagnosis not present

## 2017-02-28 LAB — CBC WITH DIFFERENTIAL/PLATELET
Basophils Absolute: 0 10*3/uL (ref 0.0–0.2)
Basos: 0 %
EOS (ABSOLUTE): 0.1 10*3/uL (ref 0.0–0.4)
Eos: 1 %
Hematocrit: 43.2 % (ref 37.5–51.0)
Hemoglobin: 15.1 g/dL (ref 13.0–17.7)
IMMATURE GRANULOCYTES: 0 %
Immature Grans (Abs): 0 10*3/uL (ref 0.0–0.1)
LYMPHS ABS: 1.8 10*3/uL (ref 0.7–3.1)
Lymphs: 25 %
MCH: 29.9 pg (ref 26.6–33.0)
MCHC: 35 g/dL (ref 31.5–35.7)
MCV: 86 fL (ref 79–97)
MONOS ABS: 0.4 10*3/uL (ref 0.1–0.9)
Monocytes: 6 %
NEUTROS PCT: 68 %
Neutrophils Absolute: 4.7 10*3/uL (ref 1.4–7.0)
Platelets: 123 10*3/uL — ABNORMAL LOW (ref 150–379)
RBC: 5.05 x10E6/uL (ref 4.14–5.80)
RDW: 14.1 % (ref 12.3–15.4)
WBC: 7 10*3/uL (ref 3.4–10.8)

## 2017-02-28 LAB — COMPREHENSIVE METABOLIC PANEL
ALK PHOS: 45 IU/L (ref 39–117)
ALT: 33 IU/L (ref 0–44)
AST: 19 IU/L (ref 0–40)
Albumin/Globulin Ratio: 1.9 (ref 1.2–2.2)
Albumin: 4.8 g/dL (ref 3.5–5.5)
BILIRUBIN TOTAL: 2.1 mg/dL — AB (ref 0.0–1.2)
BUN/Creatinine Ratio: 13 (ref 9–20)
BUN: 11 mg/dL (ref 6–24)
CO2: 20 mmol/L (ref 20–29)
CREATININE: 0.82 mg/dL (ref 0.76–1.27)
Calcium: 9.4 mg/dL (ref 8.7–10.2)
Chloride: 99 mmol/L (ref 96–106)
GFR calc Af Amer: 116 mL/min/{1.73_m2} (ref >59)
GFR calc non Af Amer: 100 mL/min/{1.73_m2} (ref >59)
GLOBULIN, TOTAL: 2.5 g/dL (ref 1.5–4.5)
Glucose: 191 mg/dL — ABNORMAL HIGH (ref 65–99)
POTASSIUM: 4 mmol/L (ref 3.5–5.2)
SODIUM: 139 mmol/L (ref 134–144)
Total Protein: 7.3 g/dL (ref 6.0–8.5)

## 2017-02-28 LAB — LACTATE DEHYDROGENASE: LDH: 187 IU/L (ref 121–224)

## 2017-02-28 LAB — HAPTOGLOBIN

## 2017-03-04 ENCOUNTER — Inpatient Hospital Stay: Payer: BLUE CROSS/BLUE SHIELD | Attending: Internal Medicine | Admitting: Internal Medicine

## 2017-03-04 ENCOUNTER — Encounter: Payer: Self-pay | Admitting: Internal Medicine

## 2017-03-04 VITALS — BP 145/91 | HR 78 | Temp 98.1°F | Resp 16 | Wt 211.8 lb

## 2017-03-04 DIAGNOSIS — Z79899 Other long term (current) drug therapy: Secondary | ICD-10-CM | POA: Diagnosis not present

## 2017-03-04 DIAGNOSIS — D696 Thrombocytopenia, unspecified: Secondary | ICD-10-CM | POA: Diagnosis not present

## 2017-03-04 DIAGNOSIS — E119 Type 2 diabetes mellitus without complications: Secondary | ICD-10-CM

## 2017-03-04 DIAGNOSIS — K760 Fatty (change of) liver, not elsewhere classified: Secondary | ICD-10-CM | POA: Diagnosis not present

## 2017-03-04 DIAGNOSIS — D580 Hereditary spherocytosis: Secondary | ICD-10-CM

## 2017-03-04 DIAGNOSIS — Z794 Long term (current) use of insulin: Secondary | ICD-10-CM | POA: Diagnosis not present

## 2017-03-04 DIAGNOSIS — R161 Splenomegaly, not elsewhere classified: Secondary | ICD-10-CM | POA: Insufficient documentation

## 2017-03-04 NOTE — Progress Notes (Signed)
[place] OFFICE PROGRESS NOTE  Patient Care Team: [place] Pink, MD as PCP - General (Family Medicine)  Cancer Staging No matching staging information was found for the patient.  # 2008- Herediatary Spherocytosis- 2008/ [Dr.Yabanez]; eval at [place] [ Haptoglobin < 1;  11/24/07: osmotic fragility  and patient's red blood cells show slightly increased osmotic fragility.  Peripheral blood smear reveals rare spherocytes. 4. Labs 01-10-07. HIV negative, HCV antibody negative, hepatitis-B antigen and antibody negative, ANA 42 negative, folic acid and vitamin B12 within normal limits, SPEP normal IFE pattern. Normal levels of Immunoglobulins. Total bilirubin 2.3, direct bilirubin 0.2, LDH 143 Coombs direct and indirect negative. 6.  PNH screen 02-13-07. No flow cytometric evidence of PNH ?? Molecular testing for Hereditary spherocytosis.   #Thrombocytopenia- 2. Ultrasound of abdomen general survey on 01-14-07. Cholelithiasis, probable fatty infiltration of liver, splenomegaly ;  CT abdomen and pelvis with contrast 01-24-07. Borderline splenomegaly. The portal vein is patent. No varices are noted. No evidence of retroperitoneal adenopathy.  # Splenomegaly/ fatty liver- 10/ 03/01/09: ultrasound spleen:Persistent splenomegaly, stable as compared to a prior exam of February 16, 2008; 2017 NOV [place]- Splenomeglay- slightly increased volume to sep 2016.   # IDDM-  .    No history exists.      INTERVAL HISTORY:  Gary Garcia. 57 y.o.  male pleasant patient above history of hereditary spherocytosis; splenomegaly and also thrombocytopenia is here for follow-up.   His appetite is good. He denies any left upper quadrant pain. Denies any weight loss. Denies any early satiety. Energy levels are adequate.  He denies any yellowing of the skin.  REVIEW OF SYSTEMS:  A complete 10 point review of system is done which is negative except mentioned above/history of present illness.   PAST MEDICAL  HISTORY : History reviewed. No pertinent past medical history.   PAST SURGICAL HISTORY :  History reviewed. No pertinent surgical history.  FAMILY HISTORY :  History reviewed. No pertinent family history.  No family history of heart disease.  SOCIAL HISTORY:   Social History   Tobacco Use  . Smoking status: Never Smoker  Substance Use Topics  . Alcohol use: Not on file  . Drug use: Not on file    ALLERGIES:  has No Known Allergies.  MEDICATIONS:  Current Outpatient Medications  Medication Sig Dispense Refill  . Cholecalciferol (VITAMIN D3) 1000 UNITS CAPS Take 1 capsule (1,000 Units total) by mouth 2 (two) times daily. 60 capsule   . Cyanocobalamin (RA VITAMIN B-12 TR) 1000 MCG TBCR Take 1 tablet (1,000 mcg total) by mouth 2 (two) times daily.    . folic acid (FOLVITE) 1 MG tablet TAKE ONE TABLET BY MOUTH ONE TIME DAILY 100 tablet 0  . Insulin Disposable Pump (V-GO 40) KIT USE AS DIRECTED.    . Insulin Pen Needle (BD PEN NEEDLE NANO U/F) 32G X 4 MM MISC USE ONE PEN NEEDLE FOUR TIMES A DAY AS DIRECTED WITH NOVOLOG AND LANTUS PENS    . metFORMIN (GLUCOPHAGE) 1000 MG tablet TAKE ONE TABLET BY MOUTH TWICE A DAY AS DIRECTED    . NOVOLOG 100 UNIT/ML injection   2  . omega-3 acid ethyl esters (LOVAZA) 1 G capsule Take by mouth.    . ONE TOUCH ULTRA TEST test strip USE FOUR TIMES A DAY AS DIRECTED  1   No current facility-administered medications for this visit.     PHYSICAL EXAMINATION: ECOG PERFORMANCE STATUS: 0 - Asymptomatic  BP (!) 145/91 (BP  Location: Left Arm, Patient Position: Sitting)   Pulse 78   Temp 98.1 F (36.7 C) (Tympanic)   Resp 16   Wt 211 lb 12.8 oz (96.1 kg)   Filed Weights   03/04/17 0843  Weight: 211 lb 12.8 oz (96.1 kg)    GENERAL: Well-nourished well-developed; Alert, no distress and comfortaAlone.EYES: no pallor or icterus OROPHARYNX: no thrush or ulceration; good dentition  NECK: supple, no masses felt LYMPH:  no palpable lymphadenopathy in the  cervical, axillary or inguinal regions LUNGS: clear to auscultation and  No wheeze or crackles HEART/CVS: regular rate & rhythm and no murmurs; No lower extremity edema ABDOMEN:abdomen soft, non-tender and normal bowel sounds; mild splenomegaly noted on exam.  Musculoskeletal:no cyanosis of digits and no clubbing  PSYCH: alert & oriented x 3 with fluent speech NEURO: no focal motor/sensory deficits SKIN:  no rashes or significant lesions  LABORATORY DATA:  I have reviewed the data as listed    Component Value Date/Time   NA 139 02/27/2017 0903   K 4.0 02/27/2017 0903   CL 99 02/27/2017 0903   CO2 20 02/27/2017 0903   GLUCOSE 191 (H) 02/27/2017 0903   BUN 11 02/27/2017 0903   CREATININE 0.82 02/27/2017 0903   CALCIUM 9.4 02/27/2017 0903   PROT 7.3 02/27/2017 0903   ALBUMIN 4.8 02/27/2017 0903   AST 19 02/27/2017 0903   ALT 33 02/27/2017 0903   ALKPHOS 45 02/27/2017 0903   BILITOT 2.1 (H) 02/27/2017 0903   GFRNONAA 100 02/27/2017 0903   GFRAA 116 02/27/2017 0903    No results found for: SPEP, UPEP  Lab Results  Component Value Date   WBC 7.0 02/27/2017   NEUTROABS 4.7 02/27/2017   HGB 15.1 02/27/2017   HCT 43.2 02/27/2017   MCV 86 02/27/2017   PLT 123 (L) 02/27/2017      Chemistry      Component Value Date/Time   NA 139 02/27/2017 0903   K 4.0 02/27/2017 0903   CL 99 02/27/2017 0903   CO2 20 02/27/2017 0903   BUN 11 02/27/2017 0903   CREATININE 0.82 02/27/2017 0903      Component Value Date/Time   CALCIUM 9.4 02/27/2017 0903   ALKPHOS 45 02/27/2017 0903   AST 19 02/27/2017 0903   ALT 33 02/27/2017 0903   BILITOT 2.1 (H) 02/27/2017 0903       RADIOGRAPHIC STUDIES: I have personally reviewed the radiological images as listed and agreed with the findings in the report. No results found.   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 Patient on folic acid. No crisis.  # Splenomegaly mild- [place]- 119 cc/ stable; reviewed the [place].   # Thrombocytopenia- platelets 123/ intermittent- question splenomegaly versus others. Monitor for now.  # Follow up in 1 year/ labs  [[facility]] 1 week prior.    No orders of the defined types were placed in this encounter.  All questions were answered. The patient knows to call the clinic with any problems, questions or concerns.      Cammie Sickle, MD 03/26/2017 8:31 PM

## 2017-03-04 NOTE — Assessment & Plan Note (Addendum)
#   Hereditary spherecytosis- [[facility]; interestingly no family history]; hemoglobin stable at 15 [[facility]]. Patient on folic acid. No crisis.  # Splenomegaly mild- US- 119 cc/ stable; reviewed the US.   # Thrombocytopenia- platelets 123/ intermittent- question splenomegaly versus others. Monitor for now.  # Follow up in 1 year/ labs  [[facility]] 1 week prior.

## 2017-03-05 ENCOUNTER — Encounter: Payer: Self-pay | Admitting: Internal Medicine

## 2017-04-15 ENCOUNTER — Other Ambulatory Visit: Payer: Self-pay

## 2017-04-15 DIAGNOSIS — Z0000 Encounter for general adult medical examination without abnormal findings: Secondary | ICD-10-CM

## 2017-04-16 LAB — CREATININE, SERUM
CREATININE: 0.86 mg/dL (ref 0.76–1.27)
GFR calc Af Amer: 113 mL/min/{1.73_m2} (ref >59)
GFR calc non Af Amer: 98 mL/min/{1.73_m2} (ref >59)

## 2017-04-16 LAB — HGB A1C W/O EAG: HEMOGLOBIN A1C: 8 % — AB (ref 4.8–5.6)

## 2017-04-16 LAB — LIPID PANEL
CHOL/HDL RATIO: 5 ratio (ref 0.0–5.0)
Cholesterol, Total: 161 mg/dL (ref 100–199)
HDL: 32 mg/dL — AB (ref >39)
LDL CALC: 77 mg/dL (ref 0–99)
TRIGLYCERIDES: 258 mg/dL — AB (ref 0–149)
VLDL Cholesterol Cal: 52 mg/dL — ABNORMAL HIGH (ref 5–40)

## 2017-04-16 LAB — VITAMIN D 25 HYDROXY (VIT D DEFICIENCY, FRACTURES): Vit D, 25-Hydroxy: 28.2 ng/mL — ABNORMAL LOW (ref 30.0–100.0)

## 2017-04-16 LAB — LIPID PANEL WITH LDL/HDL RATIO: LDl/HDL Ratio: 2.4 ratio (ref 0.0–3.6)

## 2017-04-19 ENCOUNTER — Other Ambulatory Visit: Payer: Self-pay | Admitting: Internal Medicine

## 2017-05-31 IMAGING — US US ABDOMEN COMPLETE
1 series · 14 of 25 positions shown · non-contrast
Comparison: 01/17/2015 ultrasound abdomen, CT abdomen 12/23/2013

CLINICAL DATA: Splenomegaly, hepatomegaly

EXAM:
ABDOMEN ULTRASOUND COMPLETE

[Series 1: us abdomen complete · 0.28mm/px · 14 of 97 slices shown]
[im 1/97]
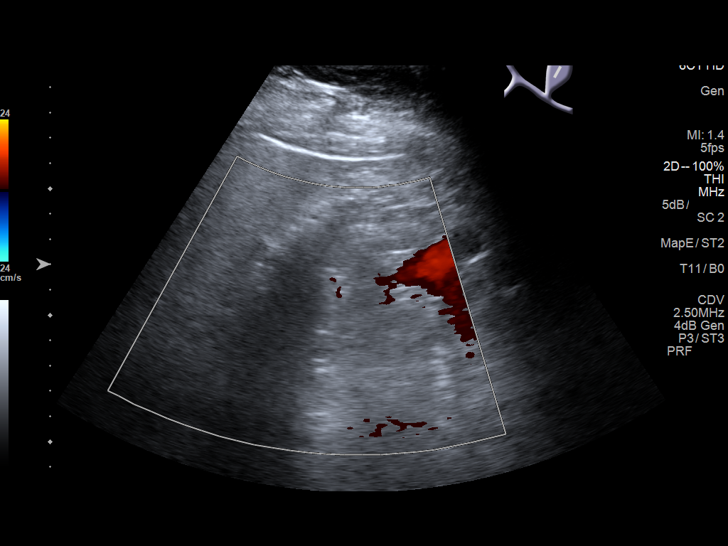
[im 9/97]
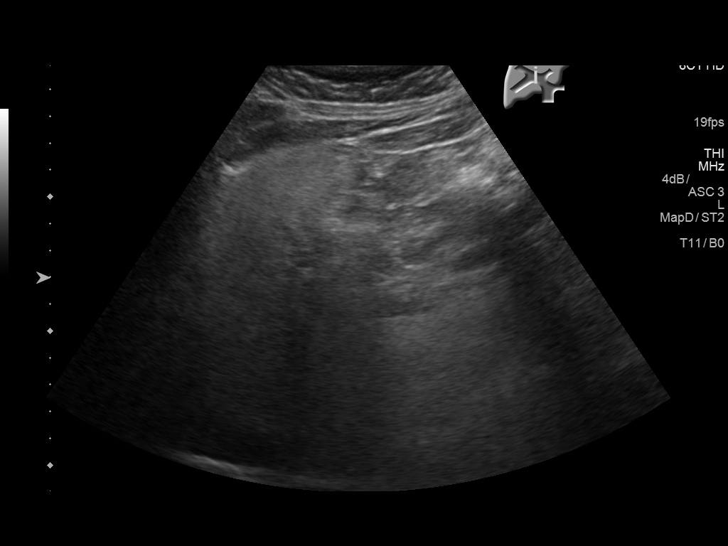
[im 17/97]
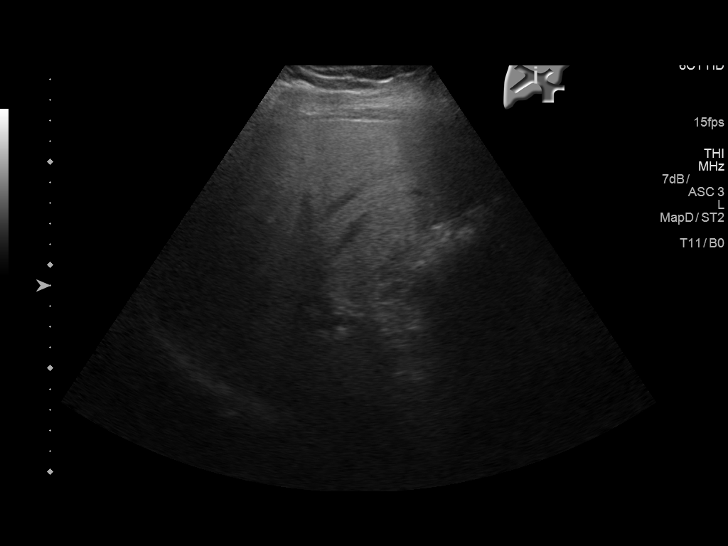
[im 25/97]
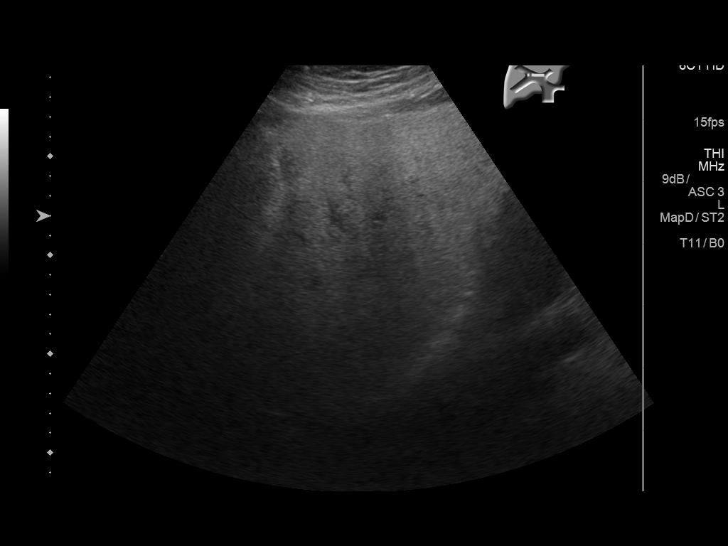
[im 33/97]
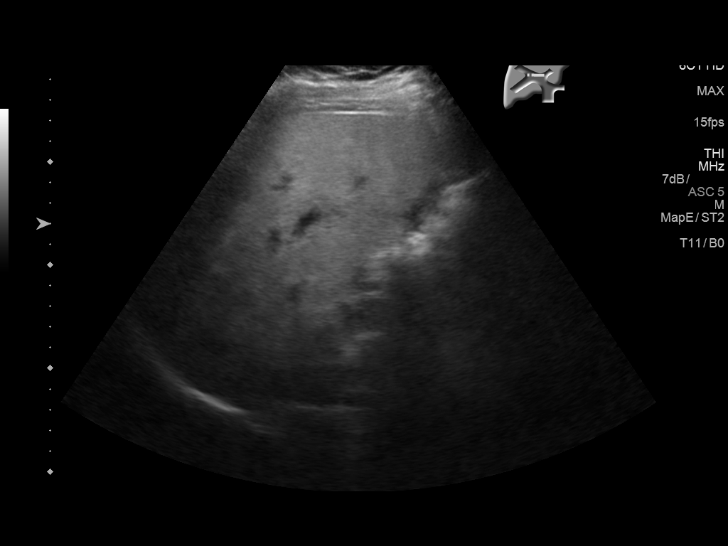
[im 37/97]
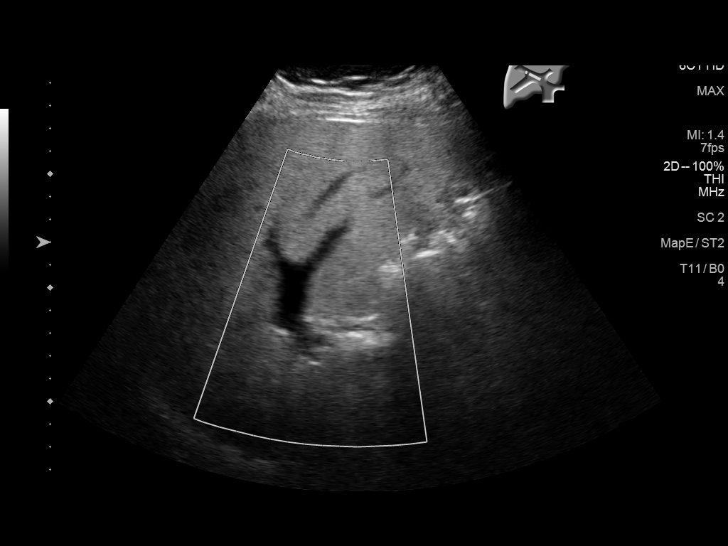
[im 45/97]
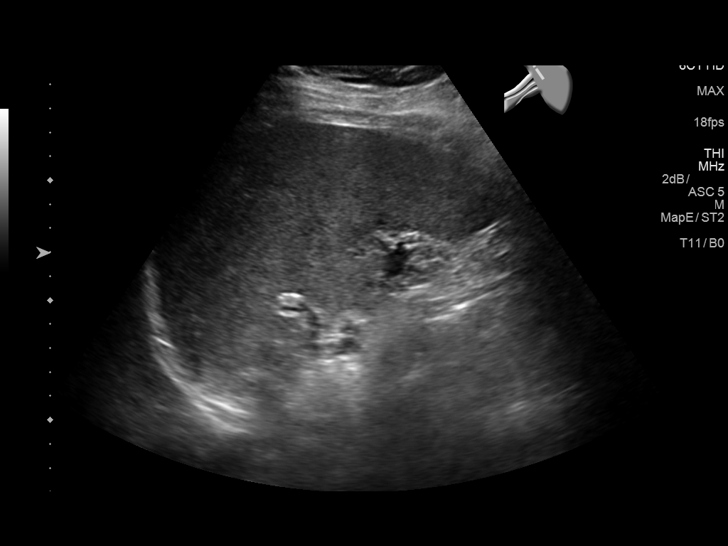
[im 53/97]
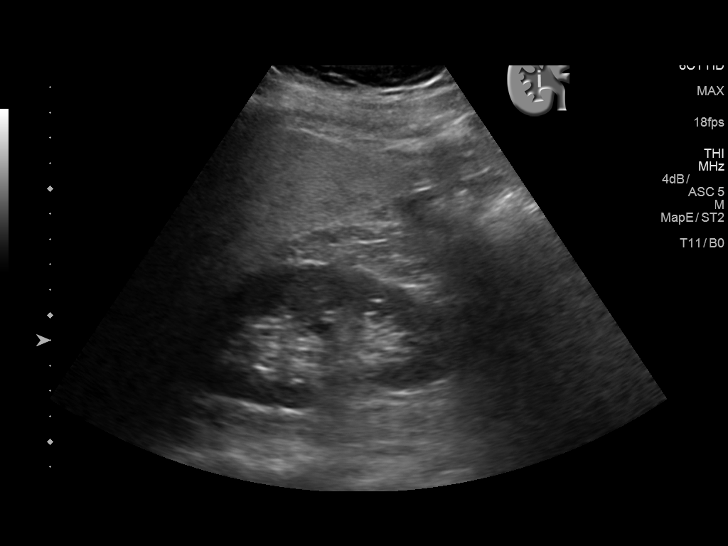
[im 61/97]
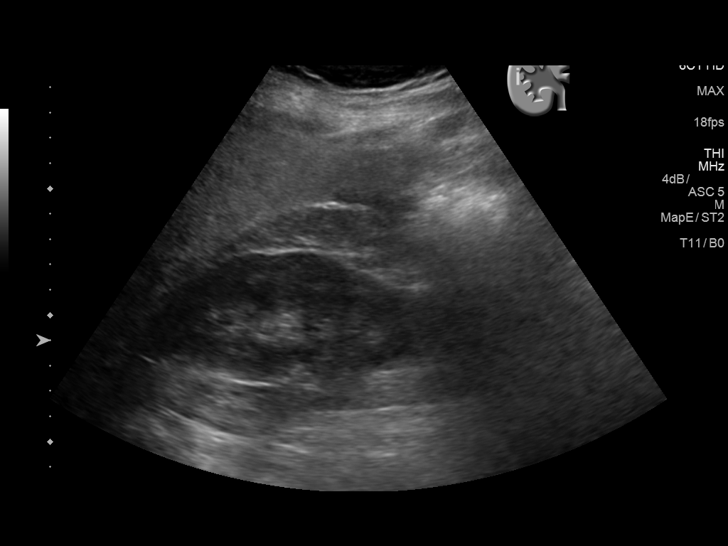
[im 65/97]
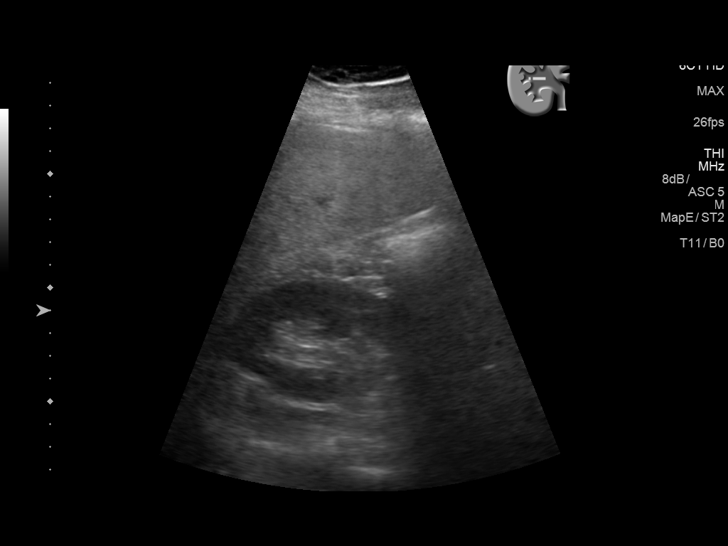
[im 73/97]
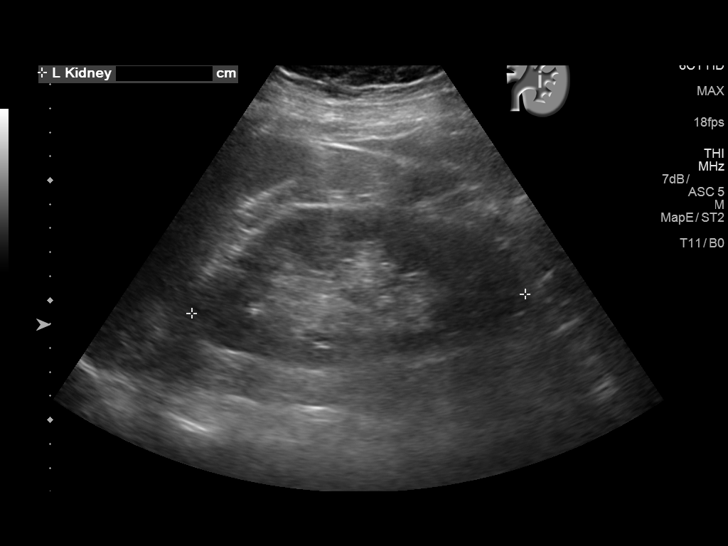
[im 81/97]
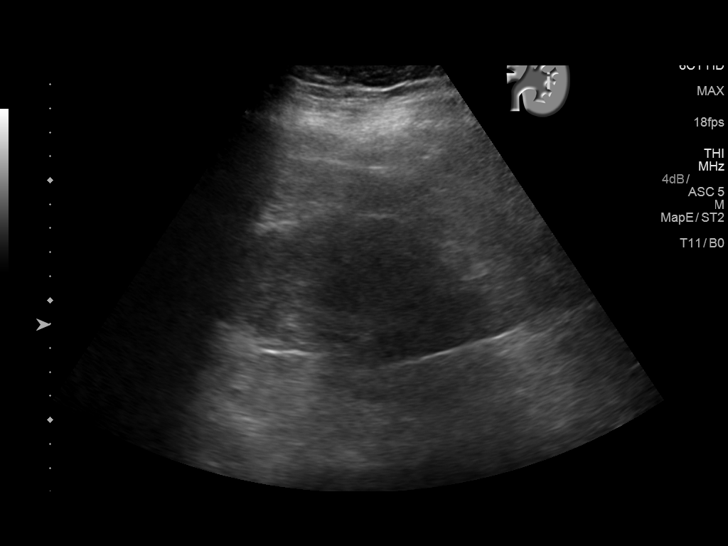
[im 89/97]
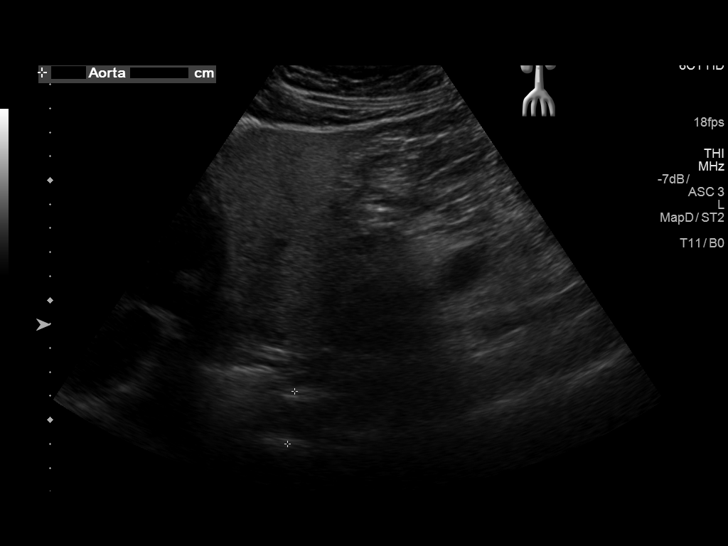
[im 97/97]
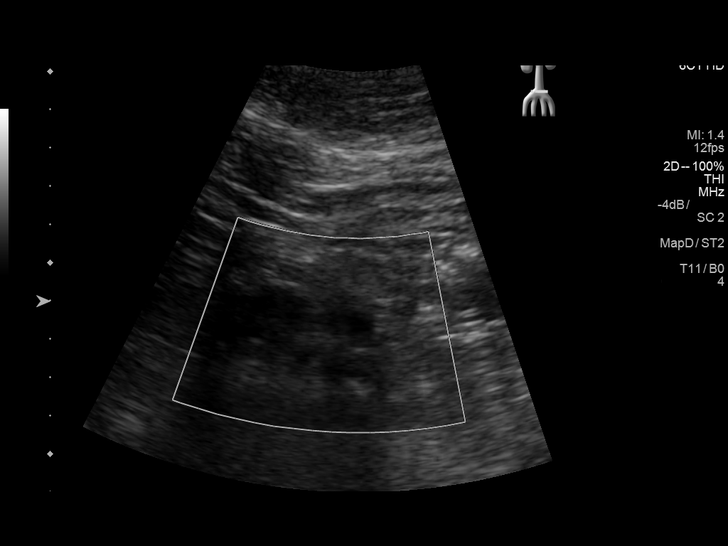

[14 of 25 positions shown; findings below may reference images not displayed]

FINDINGS: Gallbladder: Surgically absent

Common bile duct: Diameter: 6 mm diameter, normal

Liver: Echogenic, likely fatty infiltration, though this can be seen
with cirrhosis and certain infiltrative disorders. Hepatic margins
appear smooth without definite irregularity or nodularity. No
discrete hepatic mass lesion. Hepatopetal portal venous flow.

IVC: Normal appearance

Pancreas: Portions of head and tail obscured by bowel gas,
visualized portion normal appearance

Spleen: Enlarged, 14.0 x 15.6 x 7.8 cm (volume = 890 cm^3)

Right Kidney: Length: 12.9 cm. Normal morphology without mass or
hydronephrosis.

Left Kidney: Length: 13.9 cm. Normal morphology without mass or
hydronephrosis.

Abdominal aorta: Normal caliber

Other findings: No free fluid
IMPRESSION: Post cholecystectomy.

Question fatty infiltration of liver as discussed above.

Splenomegaly.

Incomplete pancreatic visualization.

## 2017-07-16 ENCOUNTER — Other Ambulatory Visit: Payer: Self-pay

## 2017-07-16 DIAGNOSIS — E782 Mixed hyperlipidemia: Secondary | ICD-10-CM

## 2017-07-16 DIAGNOSIS — E1165 Type 2 diabetes mellitus with hyperglycemia: Secondary | ICD-10-CM

## 2017-07-16 DIAGNOSIS — E559 Vitamin D deficiency, unspecified: Secondary | ICD-10-CM

## 2017-07-16 DIAGNOSIS — E538 Deficiency of other specified B group vitamins: Secondary | ICD-10-CM

## 2017-07-16 DIAGNOSIS — Z0000 Encounter for general adult medical examination without abnormal findings: Secondary | ICD-10-CM

## 2017-07-17 ENCOUNTER — Other Ambulatory Visit: Payer: Self-pay

## 2017-07-17 DIAGNOSIS — E538 Deficiency of other specified B group vitamins: Secondary | ICD-10-CM

## 2017-07-17 DIAGNOSIS — E559 Vitamin D deficiency, unspecified: Secondary | ICD-10-CM

## 2017-07-17 DIAGNOSIS — E1165 Type 2 diabetes mellitus with hyperglycemia: Secondary | ICD-10-CM

## 2017-07-17 DIAGNOSIS — Z0000 Encounter for general adult medical examination without abnormal findings: Secondary | ICD-10-CM

## 2017-07-17 DIAGNOSIS — E782 Mixed hyperlipidemia: Secondary | ICD-10-CM

## 2017-07-18 LAB — CBC WITH DIFFERENTIAL/PLATELET
BASOS: 0 %
Basophils Absolute: 0 10*3/uL (ref 0.0–0.2)
EOS (ABSOLUTE): 0.1 10*3/uL (ref 0.0–0.4)
Eos: 1 %
Hematocrit: 45.5 % (ref 37.5–51.0)
Hemoglobin: 15.3 g/dL (ref 13.0–17.7)
Immature Grans (Abs): 0 10*3/uL (ref 0.0–0.1)
Immature Granulocytes: 0 %
LYMPHS ABS: 1.7 10*3/uL (ref 0.7–3.1)
Lymphs: 23 %
MCH: 29.4 pg (ref 26.6–33.0)
MCHC: 33.6 g/dL (ref 31.5–35.7)
MCV: 87 fL (ref 79–97)
MONOS ABS: 0.4 10*3/uL (ref 0.1–0.9)
Monocytes: 5 %
Neutrophils Absolute: 5.3 10*3/uL (ref 1.4–7.0)
Neutrophils: 71 %
PLATELETS: 120 10*3/uL — AB (ref 150–450)
RBC: 5.21 x10E6/uL (ref 4.14–5.80)
RDW: 14.1 % (ref 12.3–15.4)
WBC: 7.5 10*3/uL (ref 3.4–10.8)

## 2017-07-18 LAB — LP+CREAT+HB A1C
CHOLESTEROL TOTAL: 150 mg/dL (ref 100–199)
CREATININE: 0.77 mg/dL (ref 0.76–1.27)
Chol/HDL Ratio: 5.4 ratio — ABNORMAL HIGH (ref 0.0–5.0)
GFR calc Af Amer: 118 mL/min/{1.73_m2} (ref >59)
GFR, EST NON AFRICAN AMERICAN: 102 mL/min/{1.73_m2} (ref >59)
HDL: 28 mg/dL — ABNORMAL LOW (ref >39)
Hgb A1c MFr Bld: 7.6 % — ABNORMAL HIGH (ref 4.8–5.6)
LDL Calculated: 79 mg/dL (ref 0–99)
TRIGLYCERIDES: 213 mg/dL — AB (ref 0–149)
VLDL Cholesterol Cal: 43 mg/dL — ABNORMAL HIGH (ref 5–40)

## 2017-07-18 LAB — URINALYSIS, ROUTINE W REFLEX MICROSCOPIC
BILIRUBIN UA: NEGATIVE
Glucose, UA: NEGATIVE
Leukocytes, UA: NEGATIVE
NITRITE UA: NEGATIVE
PROTEIN UA: NEGATIVE
RBC, UA: NEGATIVE
Specific Gravity, UA: 1.019 (ref 1.005–1.030)
UUROB: 0.2 mg/dL (ref 0.2–1.0)
pH, UA: 5.5 (ref 5.0–7.5)

## 2017-07-18 LAB — COMPREHENSIVE METABOLIC PANEL
A/G RATIO: 2.2 (ref 1.2–2.2)
ALT: 38 IU/L (ref 0–44)
AST: 22 IU/L (ref 0–40)
Albumin: 4.9 g/dL (ref 3.5–5.5)
Alkaline Phosphatase: 45 IU/L (ref 39–117)
BILIRUBIN TOTAL: 2.7 mg/dL — AB (ref 0.0–1.2)
BUN/Creatinine Ratio: 14 (ref 9–20)
BUN: 11 mg/dL (ref 6–24)
CALCIUM: 9.6 mg/dL (ref 8.7–10.2)
CO2: 21 mmol/L (ref 20–29)
Chloride: 102 mmol/L (ref 96–106)
GLOBULIN, TOTAL: 2.2 g/dL (ref 1.5–4.5)
GLUCOSE: 226 mg/dL — AB (ref 65–99)
Potassium: 4.7 mmol/L (ref 3.5–5.2)
SODIUM: 137 mmol/L (ref 134–144)
Total Protein: 7.1 g/dL (ref 6.0–8.5)

## 2017-07-18 LAB — VITAMIN D 25 HYDROXY (VIT D DEFICIENCY, FRACTURES): Vit D, 25-Hydroxy: 28.5 ng/mL — ABNORMAL LOW (ref 30.0–100.0)

## 2017-07-18 LAB — B12 AND FOLATE PANEL
Folate: >20 ng/mL (ref >3.0)
VITAMIN B 12: 1467 pg/mL — AB (ref 232–1245)

## 2017-07-18 LAB — PSA: PROSTATE SPECIFIC AG, SERUM: 0.5 ng/mL (ref 0.0–4.0)

## 2017-07-18 LAB — MICROALBUMIN / CREATININE URINE RATIO
Creatinine, Urine: 189.4 mg/dL
Microalb/Creat Ratio: 15.3 mg/g creat (ref 0.0–30.0)
Microalbumin, Urine: 29 ug/mL

## 2017-10-23 ENCOUNTER — Other Ambulatory Visit: Payer: Self-pay

## 2017-10-23 DIAGNOSIS — Z794 Long term (current) use of insulin: Secondary | ICD-10-CM

## 2017-10-23 DIAGNOSIS — E119 Type 2 diabetes mellitus without complications: Secondary | ICD-10-CM

## 2017-10-23 DIAGNOSIS — E559 Vitamin D deficiency, unspecified: Secondary | ICD-10-CM

## 2017-10-23 NOTE — Addendum Note (Signed)
Addended by: Sharin GraveOZART, BRANDY M on: 10/23/2017 08:52 AM   Modules accepted: Orders

## 2017-10-24 LAB — HGB A1C W/O EAG: Hgb A1c MFr Bld: 7.5 % — ABNORMAL HIGH (ref 4.8–5.6)

## 2017-10-24 LAB — VITAMIN D 25 HYDROXY (VIT D DEFICIENCY, FRACTURES): Vit D, 25-Hydroxy: 40.4 ng/mL (ref 30.0–100.0)

## 2018-02-04 ENCOUNTER — Other Ambulatory Visit: Payer: Self-pay

## 2018-02-04 DIAGNOSIS — Z794 Long term (current) use of insulin: Secondary | ICD-10-CM

## 2018-02-04 DIAGNOSIS — E119 Type 2 diabetes mellitus without complications: Secondary | ICD-10-CM

## 2018-02-05 LAB — HGB A1C W/O EAG: Hgb A1c MFr Bld: 7.4 % — ABNORMAL HIGH (ref 4.8–5.6)

## 2018-02-24 ENCOUNTER — Other Ambulatory Visit: Payer: Self-pay

## 2018-02-24 DIAGNOSIS — D580 Hereditary spherocytosis: Secondary | ICD-10-CM

## 2018-02-27 ENCOUNTER — Other Ambulatory Visit: Payer: Self-pay

## 2018-02-27 DIAGNOSIS — D580 Hereditary spherocytosis: Secondary | ICD-10-CM

## 2018-02-28 LAB — COMPREHENSIVE METABOLIC PANEL
ALBUMIN: 4.5 g/dL (ref 3.5–5.5)
ALK PHOS: 44 IU/L (ref 39–117)
ALT: 40 IU/L (ref 0–44)
AST: 24 IU/L (ref 0–40)
Albumin/Globulin Ratio: 1.7 (ref 1.2–2.2)
BILIRUBIN TOTAL: 1.7 mg/dL — AB (ref 0.0–1.2)
BUN / CREAT RATIO: 13 (ref 9–20)
BUN: 11 mg/dL (ref 6–24)
CHLORIDE: 103 mmol/L (ref 96–106)
CO2: 18 mmol/L — ABNORMAL LOW (ref 20–29)
Calcium: 9.1 mg/dL (ref 8.7–10.2)
Creatinine, Ser: 0.83 mg/dL (ref 0.76–1.27)
GFR calc Af Amer: 115 mL/min/{1.73_m2} (ref >59)
GFR calc non Af Amer: 99 mL/min/{1.73_m2} (ref >59)
GLUCOSE: 185 mg/dL — AB (ref 65–99)
Globulin, Total: 2.6 g/dL (ref 1.5–4.5)
POTASSIUM: 4.2 mmol/L (ref 3.5–5.2)
Sodium: 139 mmol/L (ref 134–144)
Total Protein: 7.1 g/dL (ref 6.0–8.5)

## 2018-02-28 LAB — CBC WITH DIFFERENTIAL/PLATELET
BASOS ABS: 0 10*3/uL (ref 0.0–0.2)
Basos: 1 %
EOS (ABSOLUTE): 0.1 10*3/uL (ref 0.0–0.4)
Eos: 2 %
Hematocrit: 42.8 % (ref 37.5–51.0)
Hemoglobin: 15.3 g/dL (ref 13.0–17.7)
Immature Grans (Abs): 0.1 10*3/uL (ref 0.0–0.1)
Immature Granulocytes: 1 %
LYMPHS ABS: 1.9 10*3/uL (ref 0.7–3.1)
Lymphs: 26 %
MCH: 30.1 pg (ref 26.6–33.0)
MCHC: 35.7 g/dL (ref 31.5–35.7)
MCV: 84 fL (ref 79–97)
Monocytes Absolute: 0.6 10*3/uL (ref 0.1–0.9)
Monocytes: 8 %
NEUTROS ABS: 4.8 10*3/uL (ref 1.4–7.0)
Neutrophils: 62 %
PLATELETS: 126 10*3/uL — AB (ref 150–450)
RBC: 5.08 x10E6/uL (ref 4.14–5.80)
RDW: 13.4 % (ref 12.3–15.4)
WBC: 7.5 10*3/uL (ref 3.4–10.8)

## 2018-03-05 ENCOUNTER — Encounter: Payer: Self-pay | Admitting: Internal Medicine

## 2018-03-05 ENCOUNTER — Other Ambulatory Visit: Payer: Self-pay

## 2018-03-05 ENCOUNTER — Inpatient Hospital Stay: Payer: BLUE CROSS/BLUE SHIELD | Attending: Internal Medicine | Admitting: Internal Medicine

## 2018-03-05 VITALS — BP 150/86 | HR 80 | Temp 97.6°F | Resp 20 | Ht 71.0 in | Wt 211.0 lb

## 2018-03-05 DIAGNOSIS — R5383 Other fatigue: Secondary | ICD-10-CM | POA: Diagnosis not present

## 2018-03-05 DIAGNOSIS — Z794 Long term (current) use of insulin: Secondary | ICD-10-CM | POA: Diagnosis not present

## 2018-03-05 DIAGNOSIS — R5381 Other malaise: Secondary | ICD-10-CM

## 2018-03-05 DIAGNOSIS — I10 Essential (primary) hypertension: Secondary | ICD-10-CM | POA: Diagnosis not present

## 2018-03-05 DIAGNOSIS — D696 Thrombocytopenia, unspecified: Secondary | ICD-10-CM

## 2018-03-05 DIAGNOSIS — E559 Vitamin D deficiency, unspecified: Secondary | ICD-10-CM | POA: Diagnosis not present

## 2018-03-05 DIAGNOSIS — D580 Hereditary spherocytosis: Secondary | ICD-10-CM

## 2018-03-05 DIAGNOSIS — E119 Type 2 diabetes mellitus without complications: Secondary | ICD-10-CM | POA: Diagnosis not present

## 2018-03-05 DIAGNOSIS — E538 Deficiency of other specified B group vitamins: Secondary | ICD-10-CM | POA: Diagnosis not present

## 2018-03-05 DIAGNOSIS — Z79899 Other long term (current) drug therapy: Secondary | ICD-10-CM

## 2018-03-05 NOTE — Assessment & Plan Note (Addendum)
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  No crisis continue folic acid.  # Thrombocytopenia- platelets 127-splenomegaly versus others.  Stable.  Again discussed no contraindication with baby aspirin if needed for cardiovascular protection.  # HTN- 150/80- recommend checking BP at home/log; if consistently elevated recommend follow-up with PCP regarding antihypertensive.  # ED-no contraindication to sildenafil with history of spherocytosis.  #Fatigue-multifactorial/hemoglobin A1c 7.4 recent; recommend better blood sugar control and also exercise.   DISPOSITION: # Follow up- MD in 1 year/ labs-cbc/cmp-Dr.B

## 2018-03-05 NOTE — Progress Notes (Signed)
[name] OFFICE PROGRESS NOTE  Patient Care Team: [place] Pink, MD as PCP - General (Family Medicine)  Cancer Staging No matching staging information was found for the patient.  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  Peripheral blood smear reveals rare spherocytes. 4. Labs 01-10-07. HIV negative, HCV antibody negative, hepatitis-B antigen and antibody negative, ANA 42 negative, folic acid and vitamin B12 within normal limits, SPEP normal IFE pattern. Normal levels of Immunoglobulins. Total bilirubin 2.3, direct bilirubin 0.2, LDH 143 [place] direct and indirect negative. 6.  PNH screen 02-13-07. No flow cytometric evidence of PNH ?? Molecular testing for Hereditary spherocytosis.   #Thrombocytopenia- 2. Ultrasound of abdomen general survey on 01-14-07. Cholelithiasis, probable fatty infiltration of liver, splenomegaly ;  CT abdomen and pelvis with contrast 01-24-07. Borderline splenomegaly. The portal vein is patent. No varices are noted. No evidence of retroperitoneal adenopathy.  # Splenomegaly/ fatty liver- 10/ 03/01/09: ultrasound spleen:Persistent splenomegaly, stable as compared to a prior exam of February 16, 2008; 2017 NOV [place]- Splenomeglay- slightly increased volume to sep 2016.   # IDDM-  .    No history exists.      INTERVAL HISTORY:  Gary Garcia. 58 y.o.  male pleasant patient above history of hereditary spherocytosis; splenomegaly and also thrombocytopenia is here for follow-up.   Patient denies any bleeding.  He denies any left upper quadrant pain nausea vomiting.  Complains of fatigue.   Wants to know if sildenafil is safe with his Arlyce Harman cytosis.  Review of Systems  Constitutional: Positive for malaise/fatigue. Negative for chills, diaphoresis, fever and weight loss.  HENT: Negative for nosebleeds  and sore throat.   Eyes: Negative for double vision.  Respiratory: Negative for cough, hemoptysis, sputum production, shortness of breath and wheezing.   Cardiovascular: Negative for chest pain, palpitations, orthopnea and leg swelling.  Gastrointestinal: Negative for abdominal pain, blood in stool, constipation, diarrhea, heartburn, melena, nausea and vomiting.  Genitourinary: Negative for dysuria, frequency and urgency.  Musculoskeletal: Negative for back pain and joint pain.  Skin: Negative.  Negative for itching and rash.  Neurological: Negative for dizziness, tingling, focal weakness, weakness and headaches.  Endo/Heme/Allergies: Does not bruise/bleed easily.  Psychiatric/Behavioral: Negative for depression. The patient is not nervous/anxious and does not have insomnia.      PAST MEDICAL HISTORY :  Past Medical History:  Diagnosis Date  . B12 deficiency   . Diabetes ([place])   . Vitamin D deficiency      PAST SURGICAL HISTORY :   Past Surgical History:  Procedure Laterality Date  . CHOLECYSTECTOMY      FAMILY HISTORY :   Family History  Problem Relation Age of Onset  . Diabetes Father   . Heart attack Father   . Other Mother        Amyloidosis    No family history of heart disease.  SOCIAL HISTORY:   Social History   Tobacco Use  . Smoking status: Never Smoker  . Smokeless tobacco: Never Used  Substance Use Topics  . Alcohol use: Never    Alcohol/week: 0.0 standard drinks    Frequency: Never  . Drug use: Never    ALLERGIES:  has No Known Allergies.  MEDICATIONS:  Current Outpatient Medications  Medication Sig Dispense Refill  . canagliflozin (INVOKANA) 300 MG TABS tablet Take 1 tablet by mouth daily.    Marland Kitchen  Cholecalciferol (VITAMIN D3) 1000 UNITS CAPS Take 1 capsule (1,000 Units total) by mouth 2 (two) times daily. 60 capsule   . Cyanocobalamin (RA VITAMIN B-12 TR) 1000 MCG TBCR Take 1 tablet (1,000 mcg total) by mouth 2 (two) times daily.    . folic acid  (FOLVITE) 1 MG tablet TAKE ONE TABLET BY MOUTH ONE TIME DAILY 100 tablet 3  . Insulin Disposable Pump (V-GO 40) KIT USE AS DIRECTED.    . Insulin Pen Needle (BD PEN NEEDLE NANO U/F) 32G X 4 MM MISC USE ONE PEN NEEDLE FOUR TIMES A DAY AS DIRECTED WITH NOVOLOG AND LANTUS PENS    . metFORMIN (GLUCOPHAGE) 1000 MG tablet TAKE ONE TABLET BY MOUTH TWICE A DAY AS DIRECTED    . NOVOLOG 100 UNIT/ML injection 76 Units. Insulin  pump  2  . omega-3 acid ethyl esters (LOVAZA) 1 G capsule Take by mouth.    . ONE TOUCH ULTRA TEST test strip USE FOUR TIMES A DAY AS DIRECTED  1   No current facility-administered medications for this visit.     PHYSICAL EXAMINATION: ECOG PERFORMANCE STATUS: 0 - Asymptomatic  BP (!) 150/86 (BP Location: Left Arm, Patient Position: Sitting)   Pulse 80   Temp 97.6 F (36.4 C) (Oral)   Resp 20   Ht 5' 11" (1.803 m)   Wt 211 lb (95.7 kg)   BMI 29.43 kg/m   Filed Weights   03/05/18 0827  Weight: 211 lb (95.7 kg)    Physical Exam  Constitutional: He is oriented to person, place, and time and well-developed, well-nourished, and in no distress.  HENT:  Head: Normocephalic and atraumatic.  Mouth/Throat: Oropharynx is clear and moist. No oropharyngeal exudate.  Eyes: Pupils are equal, round, and reactive to light.  Neck: Normal range of motion. Neck supple.  Cardiovascular: Normal rate and regular rhythm.  Pulmonary/Chest: No respiratory distress. He has no wheezes.  Abdominal: Soft. Bowel sounds are normal. He exhibits no distension and no mass. There is no abdominal tenderness. There is no rebound and no guarding.  Musculoskeletal: Normal range of motion.        General: No tenderness or edema.  Neurological: He is alert and oriented to person, place, and time.  Skin: Skin is warm.  Psychiatric: Affect normal.    LABORATORY DATA:  I have reviewed the data as listed    Component Value Date/Time   NA 139 02/27/2018 0801   K 4.2 02/27/2018 0801   CL 103  02/27/2018 0801   CO2 18 (L) 02/27/2018 0801   GLUCOSE 185 (H) 02/27/2018 0801   BUN 11 02/27/2018 0801   CREATININE 0.83 02/27/2018 0801   CALCIUM 9.1 02/27/2018 0801   PROT 7.1 02/27/2018 0801   ALBUMIN 4.5 02/27/2018 0801   AST 24 02/27/2018 0801   ALT 40 02/27/2018 0801   ALKPHOS 44 02/27/2018 0801   BILITOT 1.7 (H) 02/27/2018 0801   GFRNONAA 99 02/27/2018 0801   GFRAA 115 02/27/2018 0801    No results found for: SPEP, UPEP  Lab Results  Component Value Date   WBC 7.5 02/27/2018   NEUTROABS 4.8 02/27/2018   HGB 15.3 02/27/2018   HCT 42.8 02/27/2018   MCV 84 02/27/2018   PLT 126 (L) 02/27/2018      Chemistry      Component Value Date/Time   NA 139 02/27/2018 0801   K 4.2 02/27/2018 0801   CL 103 02/27/2018 0801   CO2 18 (L) 02/27/2018 0801     BUN 11 02/27/2018 0801   CREATININE 0.83 02/27/2018 0801      Component Value Date/Time   CALCIUM 9.1 02/27/2018 0801   ALKPHOS 44 02/27/2018 0801   AST 24 02/27/2018 0801   ALT 40 02/27/2018 0801   BILITOT 1.7 (H) 02/27/2018 0801       RADIOGRAPHIC STUDIES: I have personally reviewed the radiological images as listed and agreed with the findings in the report. No results found.   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  No crisis continue folic acid.  # Thrombocytopenia- platelets 127-splenomegaly versus others.  Stable.  Again discussed no contraindication with baby aspirin if needed for cardiovascular protection.  # HTN- 150/80- recommend checking BP at home/log; if consistently elevated recommend follow-up with PCP regarding antihypertensive.  # ED-no contraindication to sildenafil with history of spherocytosis.  #Fatigue-multifactorial/hemoglobin A1c 7.4 recent; recommend better blood sugar control and also exercise.   DISPOSITION: # Follow up- MD in 1 year/ labs-cbc/cmp-Dr.B   No orders  of the defined types were placed in this encounter.  All questions were answered. The patient knows to call the clinic with any problems, questions or concerns.      Cammie Sickle, MD 03/05/2018 10:34 AM

## 2018-04-15 ENCOUNTER — Other Ambulatory Visit: Payer: Self-pay | Admitting: Internal Medicine

## 2018-07-13 IMAGING — US US ABDOMEN LIMITED
1 series · 7 of 7 positions shown · non-contrast
Comparison: Abdominal ultrasound January 17, 2016

CLINICAL DATA: Thrombocytopenia.  History of splenomegaly.

EXAM:
ULTRASOUND ABDOMEN LIMITED

[Series 1: us abdomen limited · 0.30mm/px · 7 of 7 slices shown]
[im 1/7]
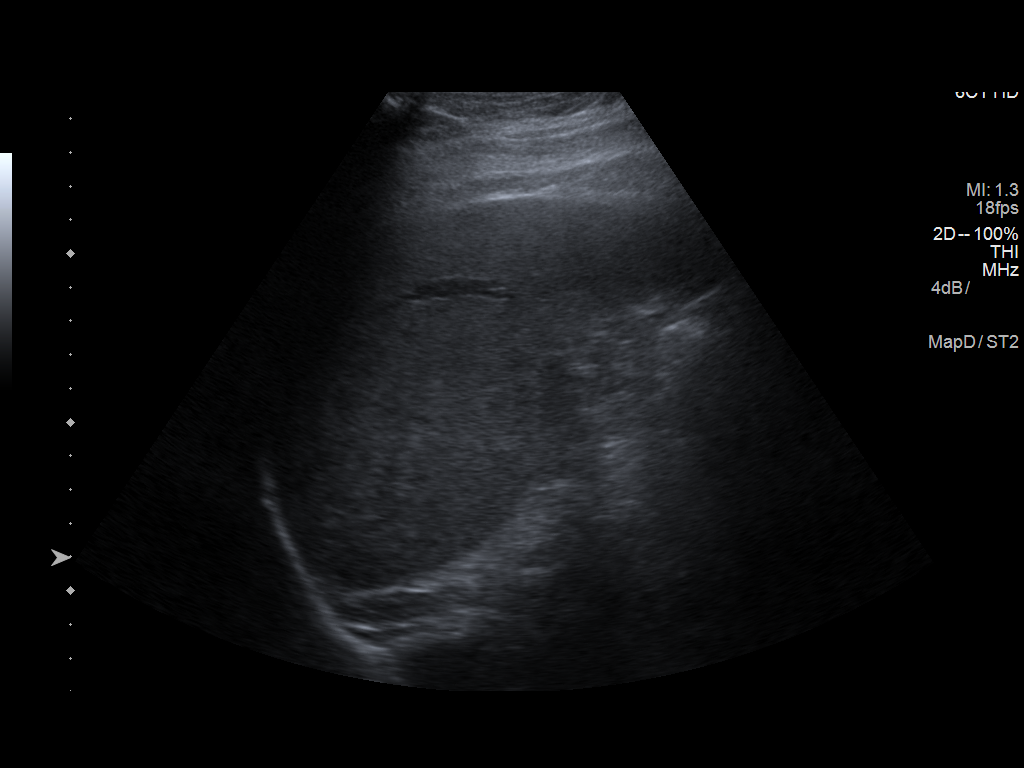
[im 2/7]
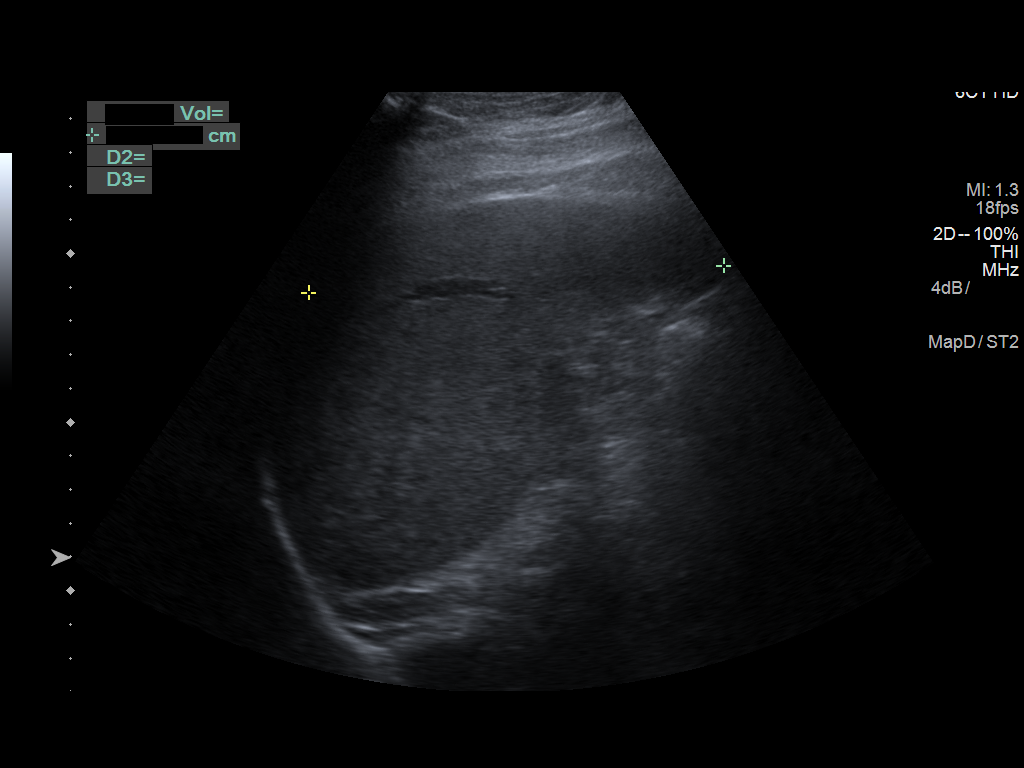
[im 3/7]
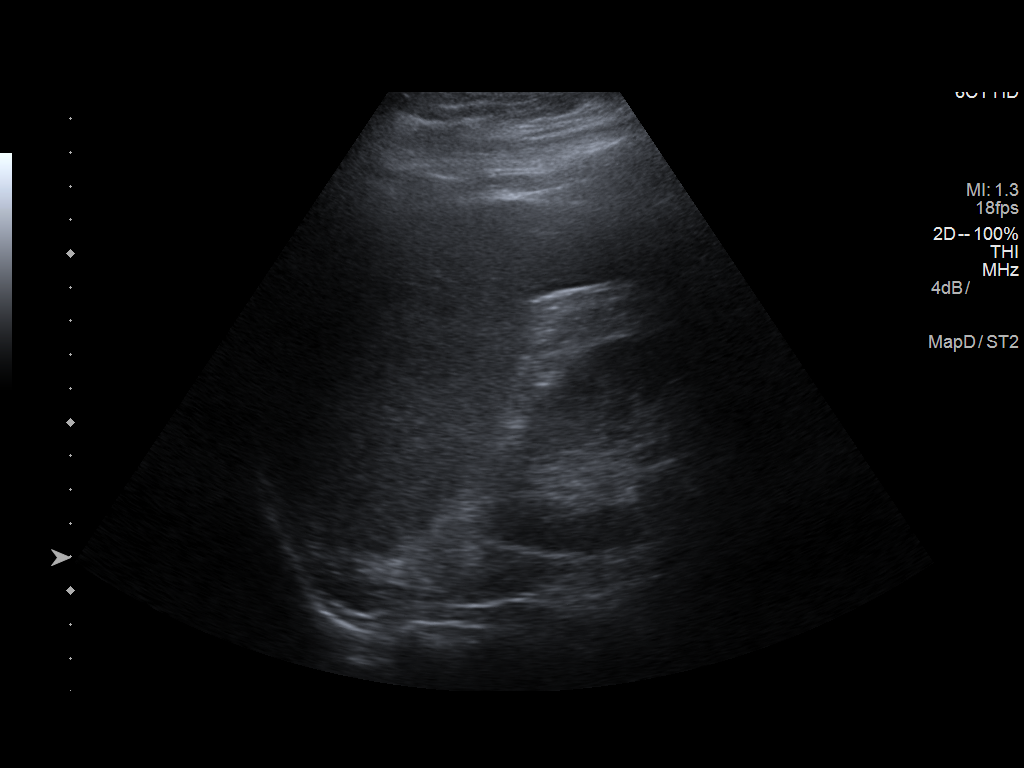
[im 4/7]
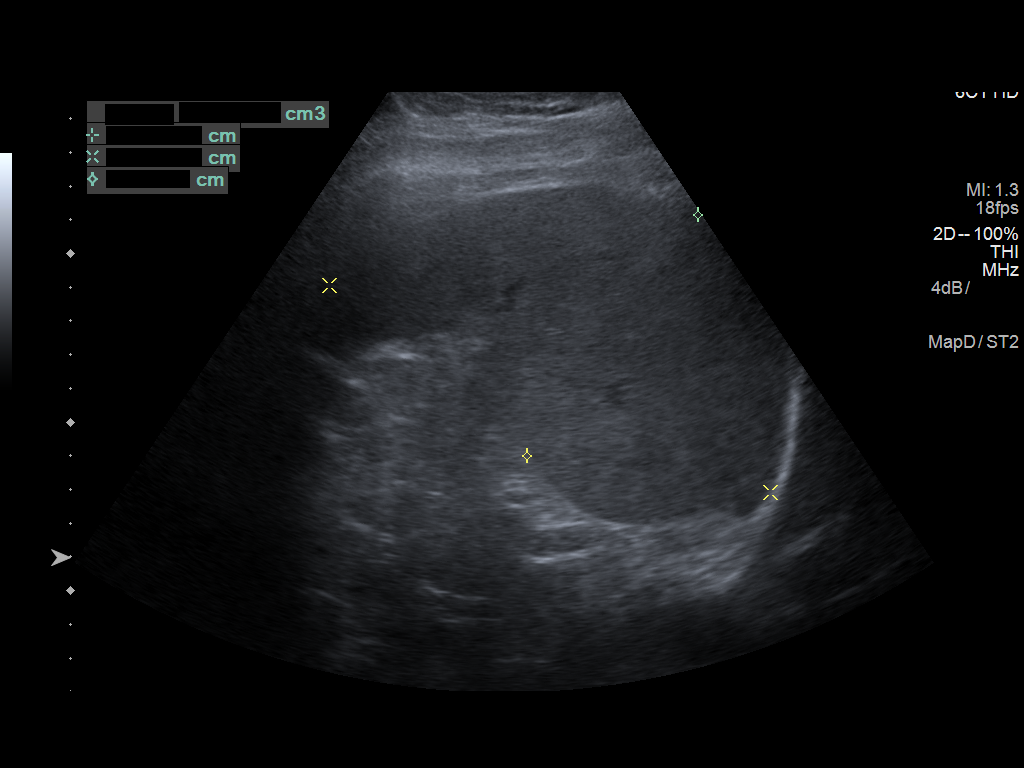
[im 5/7]
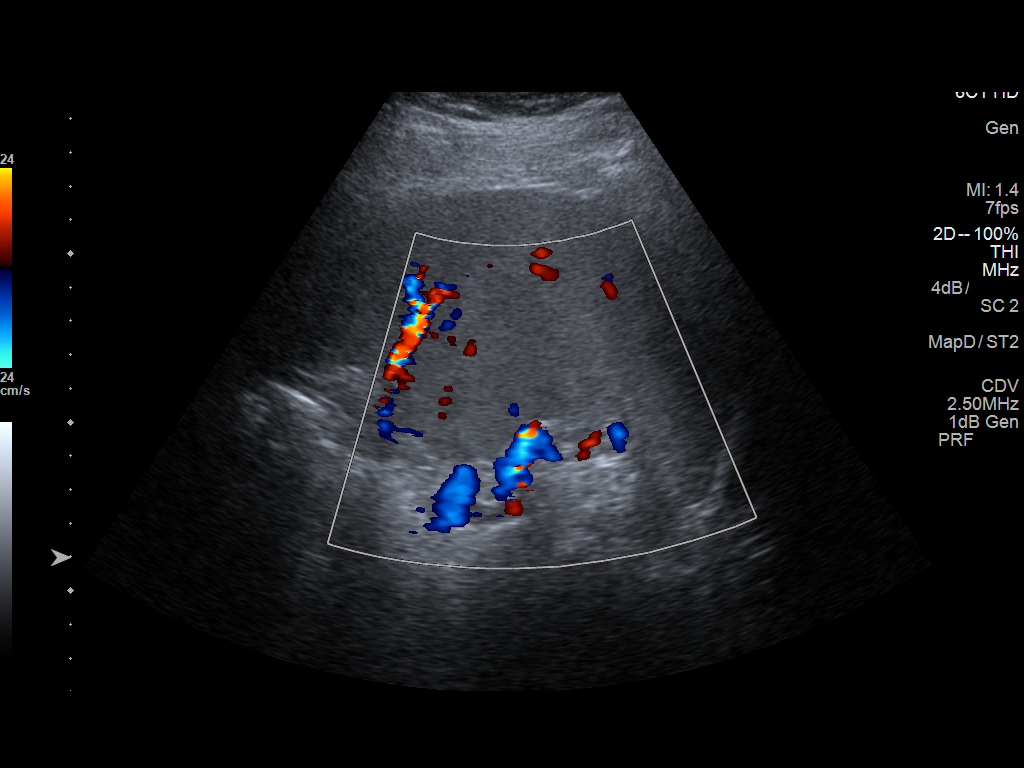
[im 6/7]
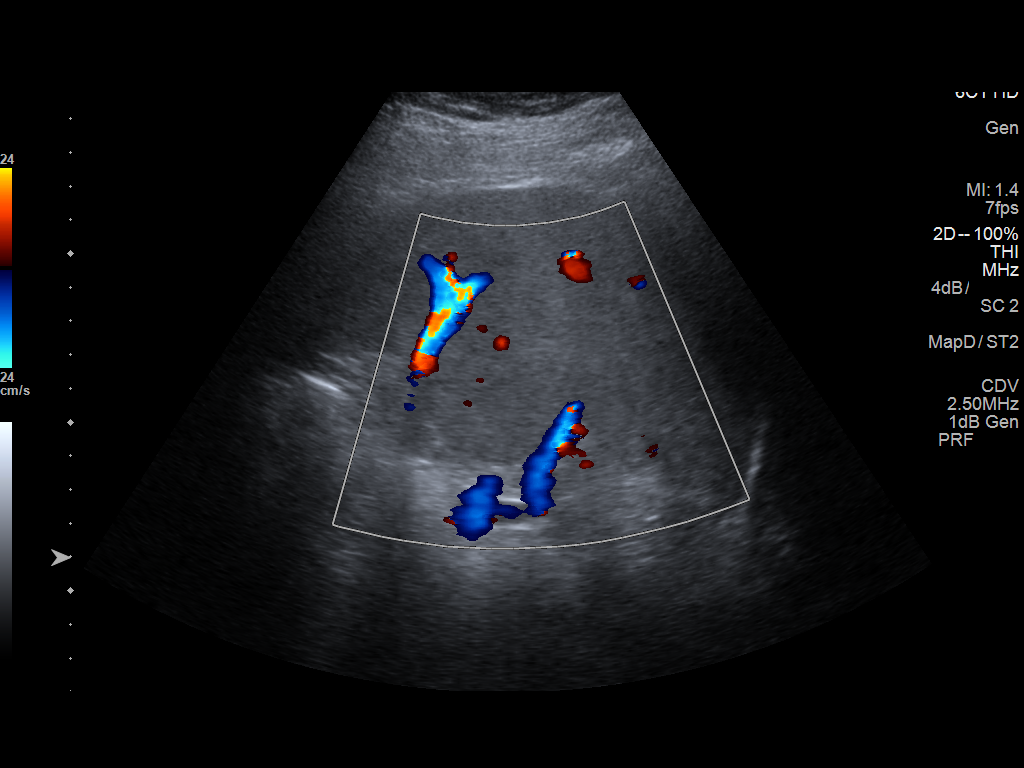
[im 7/7]
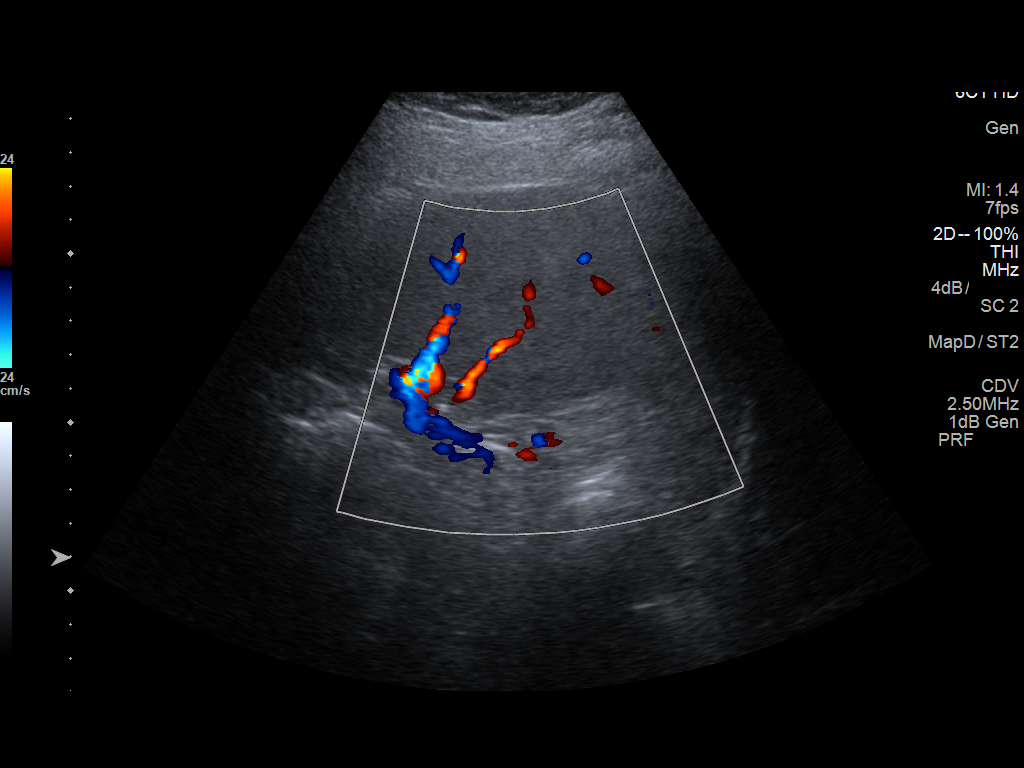

[7 of 7 positions shown; findings below may reference images not displayed]

FINDINGS: The spleen is moderately enlarged measuring 12.3 x 14.4 x 8.8 cm
with a calculated volume of 119 cc. This volume is slightly less
than that calculated in 2412. The splenic echotexture is normal.
IMPRESSION: Persistent moderate splenomegaly.

## 2018-12-08 ENCOUNTER — Other Ambulatory Visit: Payer: Self-pay

## 2018-12-08 DIAGNOSIS — Z0000 Encounter for general adult medical examination without abnormal findings: Secondary | ICD-10-CM

## 2018-12-08 DIAGNOSIS — E7800 Pure hypercholesterolemia, unspecified: Secondary | ICD-10-CM

## 2018-12-08 DIAGNOSIS — E559 Vitamin D deficiency, unspecified: Secondary | ICD-10-CM

## 2018-12-09 LAB — COMPREHENSIVE METABOLIC PANEL
ALT: 29 IU/L (ref 0–44)
AST: 22 IU/L (ref 0–40)
Albumin/Globulin Ratio: 2.1 (ref 1.2–2.2)
Albumin: 4.9 g/dL (ref 3.8–4.9)
Alkaline Phosphatase: 52 IU/L (ref 39–117)
BUN/Creatinine Ratio: 21 — ABNORMAL HIGH (ref 9–20)
BUN: 19 mg/dL (ref 6–24)
Bilirubin Total: 3.4 mg/dL — ABNORMAL HIGH (ref 0.0–1.2)
CO2: 22 mmol/L (ref 20–29)
Calcium: 9.3 mg/dL (ref 8.7–10.2)
Chloride: 99 mmol/L (ref 96–106)
Creatinine, Ser: 0.91 mg/dL (ref 0.76–1.27)
GFR calc Af Amer: 109 mL/min/{1.73_m2} (ref >59)
GFR calc non Af Amer: 94 mL/min/{1.73_m2} (ref >59)
Globulin, Total: 2.3 g/dL (ref 1.5–4.5)
Glucose: 161 mg/dL — ABNORMAL HIGH (ref 65–99)
Potassium: 4.2 mmol/L (ref 3.5–5.2)
Sodium: 137 mmol/L (ref 134–144)
Total Protein: 7.2 g/dL (ref 6.0–8.5)

## 2018-12-09 LAB — LIPID PANEL WITH LDL/HDL RATIO
Cholesterol, Total: 178 mg/dL (ref 100–199)
HDL: 34 mg/dL — ABNORMAL LOW (ref >39)
LDL Chol Calc (NIH): 113 mg/dL — ABNORMAL HIGH (ref 0–99)
LDL/HDL Ratio: 3.3 ratio (ref 0.0–3.6)
Triglycerides: 175 mg/dL — ABNORMAL HIGH (ref 0–149)
VLDL Cholesterol Cal: 31 mg/dL (ref 5–40)

## 2018-12-09 LAB — MICROALBUMIN / CREATININE URINE RATIO
Creatinine, Urine: 113.6 mg/dL
Microalb/Creat Ratio: 5 mg/g creat (ref 0–29)
Microalbumin, Urine: 5.4 ug/mL

## 2018-12-09 LAB — TSH: TSH: 2.41 u[IU]/mL (ref 0.450–4.500)

## 2018-12-09 LAB — VITAMIN D 25 HYDROXY (VIT D DEFICIENCY, FRACTURES): Vit D, 25-Hydroxy: 47.8 ng/mL (ref 30.0–100.0)

## 2019-02-13 ENCOUNTER — Other Ambulatory Visit: Payer: Self-pay

## 2019-02-13 ENCOUNTER — Other Ambulatory Visit: Payer: BC Managed Care – PPO

## 2019-02-13 DIAGNOSIS — Z0000 Encounter for general adult medical examination without abnormal findings: Secondary | ICD-10-CM

## 2019-02-14 LAB — CBC WITH DIFFERENTIAL/PLATELET
Basophils Absolute: 0 10*3/uL (ref 0.0–0.2)
Basos: 1 %
EOS (ABSOLUTE): 0.1 10*3/uL (ref 0.0–0.4)
Eos: 1 %
Hematocrit: 49.7 % (ref 37.5–51.0)
Hemoglobin: 17.4 g/dL (ref 13.0–17.7)
Immature Grans (Abs): 0 10*3/uL (ref 0.0–0.1)
Immature Granulocytes: 0 %
Lymphocytes Absolute: 1.9 10*3/uL (ref 0.7–3.1)
Lymphs: 25 %
MCH: 29.8 pg (ref 26.6–33.0)
MCHC: 35 g/dL (ref 31.5–35.7)
MCV: 85 fL (ref 79–97)
Monocytes Absolute: 0.4 10*3/uL (ref 0.1–0.9)
Monocytes: 6 %
Neutrophils Absolute: 5.1 10*3/uL (ref 1.4–7.0)
Neutrophils: 67 %
Platelets: 145 10*3/uL — ABNORMAL LOW (ref 150–450)
RBC: 5.83 x10E6/uL — ABNORMAL HIGH (ref 4.14–5.80)
RDW: 13.4 % (ref 11.6–15.4)
WBC: 7.6 10*3/uL (ref 3.4–10.8)

## 2019-02-14 LAB — COMPREHENSIVE METABOLIC PANEL
ALT: 36 IU/L (ref 0–44)
AST: 28 IU/L (ref 0–40)
Albumin/Globulin Ratio: 2.1 (ref 1.2–2.2)
Albumin: 5 g/dL — ABNORMAL HIGH (ref 3.8–4.9)
Alkaline Phosphatase: 52 IU/L (ref 39–117)
BUN/Creatinine Ratio: 20 (ref 9–20)
BUN: 15 mg/dL (ref 6–24)
Bilirubin Total: 2 mg/dL — ABNORMAL HIGH (ref 0.0–1.2)
CO2: 21 mmol/L (ref 20–29)
Calcium: 9.8 mg/dL (ref 8.7–10.2)
Chloride: 101 mmol/L (ref 96–106)
Creatinine, Ser: 0.75 mg/dL — ABNORMAL LOW (ref 0.76–1.27)
GFR calc Af Amer: 119 mL/min/{1.73_m2} (ref >59)
GFR calc non Af Amer: 103 mL/min/{1.73_m2} (ref >59)
Globulin, Total: 2.4 g/dL (ref 1.5–4.5)
Glucose: 168 mg/dL — ABNORMAL HIGH (ref 65–99)
Potassium: 4.5 mmol/L (ref 3.5–5.2)
Sodium: 139 mmol/L (ref 134–144)
Total Protein: 7.4 g/dL (ref 6.0–8.5)

## 2019-03-03 ENCOUNTER — Other Ambulatory Visit: Payer: Self-pay

## 2019-03-06 ENCOUNTER — Other Ambulatory Visit: Payer: Self-pay | Admitting: *Deleted

## 2019-03-06 ENCOUNTER — Inpatient Hospital Stay: Payer: BC Managed Care – PPO | Attending: Internal Medicine | Admitting: Internal Medicine

## 2019-03-06 ENCOUNTER — Encounter: Payer: Self-pay | Admitting: Licensed Clinical Social Worker

## 2019-03-06 DIAGNOSIS — D580 Hereditary spherocytosis: Secondary | ICD-10-CM

## 2019-03-06 DIAGNOSIS — D696 Thrombocytopenia, unspecified: Secondary | ICD-10-CM

## 2019-03-06 NOTE — Progress Notes (Signed)
I connected with Gary Garcia. on 03/06/19 at  8:30 AM EST by video enabled telemedicine visit and verified that I am speaking with the correct person using two identifiers.  I discussed the limitations, risks, security and privacy concerns of performing an evaluation and management service by telemedicine and the availability of in-person appointments. I also discussed with the patient that there may be a patient responsible charge related to this service. The patient expressed understanding and agreed to proceed.    Other persons participating in the visit and their role in the encounter: RN/medical reconciliation Patient's location: work Provider's location: Office  Oncology History   No history exists.     Chief Complaint: Spherocytosis   History of present illness:Gary Garcia. 59 y.o.  male with history of hereditary spherocytosis is here for follow-up.  Patient denies any abdominal pain nausea vomiting.  Denies any fevers or chills.  Appetite is good but no weight loss.  Patient has not had any infection with Covid.   Observation/objective: Hemoglobin 13 platelets 145.  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  No crisis continue folic acid.  # Thrombocytopenia- platelets 145-plenomegaly versus others.  STABLE.  Again discussed no contraindication with baby aspirin if needed for cardiovascular protection.  # I discussed regarding Covid precautions/and also discussed proceeding with Covid vaccination when available.  Discussed that unfortunately the data safety and efficacy of vaccination is unclear especially in patients with immunocompromised state.  However, I think the benefits of the vaccination outweigh the potential risks.  # Pt had question re: charges/ will fo  DISPOSITION: # Follow up- MD in 1 year/  labs-cbc/cmp-Dr.B  Follow-up instructions:  I discussed the assessment and treatment plan with the patient.  The patient was provided an opportunity to ask questions and all were answered.  The patient agreed with the plan and demonstrated understanding of instructions.  The patient was advised to call back or seek an in person evaluation if the symptoms worsen or if the condition fails to improve as anticipated.  Dr. Louretta Shorten [facility] at [facility] 03/06/2019 8:56 AM

## 2019-03-06 NOTE — Assessment & Plan Note (Addendum)
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  No crisis continue folic acid.  # Thrombocytopenia- platelets 145-plenomegaly versus others.  STABLE.  Again discussed no contraindication with baby aspirin if needed for cardiovascular protection.  # I discussed regarding Covid precautions/and also discussed proceeding with Covid vaccination when available.  Discussed that unfortunately the data safety and efficacy of vaccination is unclear especially in patients with immunocompromised state.  However, I think the benefits of the vaccination outweigh the potential risks.  # Pt had question re: charges/ will fo  DISPOSITION: # Follow up- MD in 1 year/ labs-cbc/cmp-Dr.B

## 2019-03-30 ENCOUNTER — Other Ambulatory Visit: Payer: Self-pay

## 2019-03-30 ENCOUNTER — Ambulatory Visit: Payer: Self-pay | Admitting: *Deleted

## 2019-03-30 DIAGNOSIS — Z20822 Contact with and (suspected) exposure to covid-19: Secondary | ICD-10-CM

## 2019-03-30 LAB — POC COVID19 BINAXNOW: SARS Coronavirus 2 Ag: POSITIVE — AB

## 2019-03-31 ENCOUNTER — Encounter: Payer: Self-pay | Admitting: Medical

## 2019-06-09 ENCOUNTER — Other Ambulatory Visit: Payer: BC Managed Care – PPO

## 2019-06-09 ENCOUNTER — Other Ambulatory Visit: Payer: Self-pay

## 2019-06-09 DIAGNOSIS — E785 Hyperlipidemia, unspecified: Secondary | ICD-10-CM

## 2019-06-09 DIAGNOSIS — E119 Type 2 diabetes mellitus without complications: Secondary | ICD-10-CM

## 2019-06-09 DIAGNOSIS — E1169 Type 2 diabetes mellitus with other specified complication: Secondary | ICD-10-CM

## 2019-06-09 DIAGNOSIS — Z794 Long term (current) use of insulin: Secondary | ICD-10-CM

## 2019-06-10 LAB — COMPREHENSIVE METABOLIC PANEL
ALT: 32 IU/L (ref 0–44)
AST: 30 IU/L (ref 0–40)
Albumin/Globulin Ratio: 1.9 (ref 1.2–2.2)
Albumin: 4.8 g/dL (ref 3.8–4.9)
Alkaline Phosphatase: 53 IU/L (ref 39–117)
BUN/Creatinine Ratio: 13 (ref 9–20)
BUN: 12 mg/dL (ref 6–24)
Bilirubin Total: 3.2 mg/dL — ABNORMAL HIGH (ref 0.0–1.2)
CO2: 21 mmol/L (ref 20–29)
Calcium: 9.6 mg/dL (ref 8.7–10.2)
Chloride: 100 mmol/L (ref 96–106)
Creatinine, Ser: 0.91 mg/dL (ref 0.76–1.27)
GFR calc Af Amer: 108 mL/min/{1.73_m2} (ref >59)
GFR calc non Af Amer: 93 mL/min/{1.73_m2} (ref >59)
Globulin, Total: 2.5 g/dL (ref 1.5–4.5)
Glucose: 186 mg/dL — ABNORMAL HIGH (ref 65–99)
Potassium: 4.8 mmol/L (ref 3.5–5.2)
Sodium: 137 mmol/L (ref 134–144)
Total Protein: 7.3 g/dL (ref 6.0–8.5)

## 2019-06-10 LAB — HGB A1C W/O EAG: Hgb A1c MFr Bld: 7.6 % — ABNORMAL HIGH (ref 4.8–5.6)

## 2019-06-10 LAB — HEPATIC FUNCTION PANEL: Bilirubin, Direct: 0.5 mg/dL — ABNORMAL HIGH (ref 0.00–0.40)

## 2019-07-03 ENCOUNTER — Ambulatory Visit: Payer: BC Managed Care – PPO

## 2019-07-08 ENCOUNTER — Ambulatory Visit: Payer: BC Managed Care – PPO | Attending: Internal Medicine

## 2019-07-08 ENCOUNTER — Ambulatory Visit: Payer: BC Managed Care – PPO

## 2019-07-08 DIAGNOSIS — Z23 Encounter for immunization: Secondary | ICD-10-CM

## 2019-07-08 NOTE — Progress Notes (Signed)
   [id number]  Name:  Gary Garcia.    MRN: [id number] DOB: [birth year]  07/08/2019  Gary Garcia was observed post Covid-19 immunization for 15 minutes without incident. He was provided with Vaccine Information Sheet and instruction to access the V-Safe system.   Gary Garcia was instructed to call 911 with any severe reactions post vaccine: Marland Kitchen Difficulty breathing  . Swelling of face and throat  . A fast heartbeat  . A bad rash all over body  . Dizziness and weakness   Immunizations Administered    Name Date Dose VIS Date Route   Pfizer COVID-19 Vaccine 07/08/2019 12:11 PM 0.3 mL 04/22/2018 Intramuscular   Manufacturer: [employer], [employer]   Lot: [id number]   NDC: [address]-1

## 2019-07-29 ENCOUNTER — Other Ambulatory Visit: Payer: Self-pay | Admitting: Internal Medicine

## 2019-08-04 ENCOUNTER — Ambulatory Visit: Payer: BC Managed Care – PPO | Attending: Internal Medicine

## 2019-08-04 DIAGNOSIS — Z23 Encounter for immunization: Secondary | ICD-10-CM

## 2019-08-04 NOTE — Progress Notes (Signed)
   [id number]  Name:  Gary Garcia.    MRN: [id number] DOB: 21-Apr-1960  08/04/2019  [name] was observed post Covid-19 immunization for 15 minutes without incident. He was provided with Vaccine Information Sheet and instruction to access the V-Safe system.   [name] was instructed to call 911 with any severe reactions post vaccine: Marland Kitchen Difficulty breathing  . Swelling of face and throat  . A fast heartbeat  . A bad rash all over body  . Dizziness and weakness   Immunizations Administered    Name Date Dose VIS Date Route   Pfizer COVID-19 Vaccine 08/04/2019 12:18 PM 0.3 mL 04/22/2018 Intramuscular   Manufacturer: [employer], [employer]   Lot: [id number]   NDC: [address]-6

## 2019-12-25 ENCOUNTER — Other Ambulatory Visit: Payer: Self-pay

## 2019-12-25 ENCOUNTER — Other Ambulatory Visit: Payer: BC Managed Care – PPO

## 2019-12-25 DIAGNOSIS — E559 Vitamin D deficiency, unspecified: Secondary | ICD-10-CM

## 2019-12-25 DIAGNOSIS — E1169 Type 2 diabetes mellitus with other specified complication: Secondary | ICD-10-CM

## 2019-12-25 DIAGNOSIS — E119 Type 2 diabetes mellitus without complications: Secondary | ICD-10-CM

## 2019-12-26 LAB — COMPREHENSIVE METABOLIC PANEL
ALT: 39 IU/L (ref 0–44)
AST: 32 IU/L (ref 0–40)
Albumin/Globulin Ratio: 2.2 (ref 1.2–2.2)
Albumin: 4.9 g/dL (ref 3.8–4.9)
Alkaline Phosphatase: 50 IU/L (ref 44–121)
BUN/Creatinine Ratio: 16 (ref 9–20)
BUN: 14 mg/dL (ref 6–24)
Bilirubin Total: 2.8 mg/dL — ABNORMAL HIGH (ref 0.0–1.2)
CO2: 23 mmol/L (ref 20–29)
Calcium: 9.2 mg/dL (ref 8.7–10.2)
Chloride: 100 mmol/L (ref 96–106)
Creatinine, Ser: 0.89 mg/dL (ref 0.76–1.27)
GFR calc Af Amer: 110 mL/min/{1.73_m2} (ref >59)
GFR calc non Af Amer: 95 mL/min/{1.73_m2} (ref >59)
Globulin, Total: 2.2 g/dL (ref 1.5–4.5)
Glucose: 136 mg/dL — ABNORMAL HIGH (ref 65–99)
Potassium: 4.5 mmol/L (ref 3.5–5.2)
Sodium: 138 mmol/L (ref 134–144)
Total Protein: 7.1 g/dL (ref 6.0–8.5)

## 2019-12-26 LAB — LIPID PANEL
Chol/HDL Ratio: 6.1 ratio — ABNORMAL HIGH (ref 0.0–5.0)
Cholesterol, Total: 184 mg/dL (ref 100–199)
HDL: 30 mg/dL — ABNORMAL LOW (ref >39)
LDL Chol Calc (NIH): 117 mg/dL — ABNORMAL HIGH (ref 0–99)
Triglycerides: 207 mg/dL — ABNORMAL HIGH (ref 0–149)
VLDL Cholesterol Cal: 37 mg/dL (ref 5–40)

## 2019-12-26 LAB — VITAMIN D 25 HYDROXY (VIT D DEFICIENCY, FRACTURES): Vit D, 25-Hydroxy: 42.3 ng/mL (ref 30.0–100.0)

## 2019-12-26 LAB — MICROALBUMIN / CREATININE URINE RATIO
Creatinine, Urine: 123.2 mg/dL
Microalb/Creat Ratio: 7 mg/g creat (ref 0–29)
Microalbumin, Urine: 8.6 ug/mL

## 2019-12-26 LAB — HGB A1C W/O EAG: Hgb A1c MFr Bld: 7 % — ABNORMAL HIGH (ref 4.8–5.6)

## 2019-12-26 LAB — TSH: TSH: 2.19 u[IU]/mL (ref 0.450–4.500)

## 2020-02-04 ENCOUNTER — Telehealth: Payer: Self-pay | Admitting: *Deleted

## 2020-02-04 NOTE — Telephone Encounter (Signed)
02/03/2020-Incoming phone call from patient to request for his lab orders to faxed to [facility] at [phone]. This was faxed per his request

## 2020-03-01 ENCOUNTER — Other Ambulatory Visit: Payer: Self-pay

## 2020-03-01 ENCOUNTER — Other Ambulatory Visit: Payer: BC Managed Care – PPO

## 2020-03-01 DIAGNOSIS — D580 Hereditary spherocytosis: Secondary | ICD-10-CM

## 2020-03-02 LAB — CBC WITH DIFFERENTIAL/PLATELET
Basophils Absolute: 0 10*3/uL (ref 0.0–0.2)
Basos: 1 %
EOS (ABSOLUTE): 0.1 10*3/uL (ref 0.0–0.4)
Eos: 2 %
Hematocrit: 49.1 % (ref 37.5–51.0)
Hemoglobin: 17.3 g/dL (ref 13.0–17.7)
Immature Grans (Abs): 0 10*3/uL (ref 0.0–0.1)
Immature Granulocytes: 0 %
Lymphocytes Absolute: 1.8 10*3/uL (ref 0.7–3.1)
Lymphs: 27 %
MCH: 30.1 pg (ref 26.6–33.0)
MCHC: 35.2 g/dL (ref 31.5–35.7)
MCV: 85 fL (ref 79–97)
Monocytes Absolute: 0.6 10*3/uL (ref 0.1–0.9)
Monocytes: 9 %
Neutrophils Absolute: 4.2 10*3/uL (ref 1.4–7.0)
Neutrophils: 61 %
Platelets: 138 10*3/uL — ABNORMAL LOW (ref 150–450)
RBC: 5.75 x10E6/uL (ref 4.14–5.80)
RDW: 13 % (ref 11.6–15.4)
WBC: 6.7 10*3/uL (ref 3.4–10.8)

## 2020-03-02 LAB — COMPREHENSIVE METABOLIC PANEL
ALT: 47 IU/L — ABNORMAL HIGH (ref 0–44)
AST: 38 IU/L (ref 0–40)
Albumin/Globulin Ratio: 2.1 (ref 1.2–2.2)
Albumin: 5 g/dL — ABNORMAL HIGH (ref 3.8–4.9)
Alkaline Phosphatase: 53 IU/L (ref 44–121)
BUN/Creatinine Ratio: 21 — ABNORMAL HIGH (ref 9–20)
BUN: 17 mg/dL (ref 6–24)
Bilirubin Total: 4 mg/dL — ABNORMAL HIGH (ref 0.0–1.2)
CO2: 19 mmol/L — ABNORMAL LOW (ref 20–29)
Calcium: 9.2 mg/dL (ref 8.7–10.2)
Chloride: 100 mmol/L (ref 96–106)
Creatinine, Ser: 0.82 mg/dL (ref 0.76–1.27)
GFR calc Af Amer: 113 mL/min/{1.73_m2} (ref >59)
GFR calc non Af Amer: 98 mL/min/{1.73_m2} (ref >59)
Globulin, Total: 2.4 g/dL (ref 1.5–4.5)
Glucose: 199 mg/dL — ABNORMAL HIGH (ref 65–99)
Potassium: 4.5 mmol/L (ref 3.5–5.2)
Sodium: 136 mmol/L (ref 134–144)
Total Protein: 7.4 g/dL (ref 6.0–8.5)

## 2020-03-07 ENCOUNTER — Other Ambulatory Visit: Payer: BC Managed Care – PPO

## 2020-03-07 ENCOUNTER — Other Ambulatory Visit: Payer: Self-pay

## 2020-03-07 ENCOUNTER — Inpatient Hospital Stay: Payer: BC Managed Care – PPO | Attending: Internal Medicine | Admitting: Internal Medicine

## 2020-03-07 DIAGNOSIS — D696 Thrombocytopenia, unspecified: Secondary | ICD-10-CM | POA: Insufficient documentation

## 2020-03-07 DIAGNOSIS — D580 Hereditary spherocytosis: Secondary | ICD-10-CM | POA: Insufficient documentation

## 2020-03-07 NOTE — Progress Notes (Signed)
I connected with Gary Garcia. on 03/07/20 at  1:45 PM EST by video enabled telemedicine visit and verified that I am speaking with the correct person using two identifiers.  I discussed the limitations, risks, security and privacy concerns of performing an evaluation and management service by telemedicine and the availability of in-person appointments. I also discussed with the patient that there may be a patient responsible charge related to this service. The patient expressed understanding and agreed to proceed.    Other persons participating in the visit and their role in the encounter: RN/medical reconciliation Patient's location: home Provider's location: office  Oncology History   No history exists.     Chief Complaint: Hereditary spherocytosis   History of present illness:Gary Garcia. 60 y.o.  male with history of Hereditary spherocytosis.  Patient denies any extreme fatigue.  Patient denies any nausea vomiting abdominal pain.  Denies any shortness of breath or cough.  Observation/objective: Hemoglobin 13.9.  Bilirubin 4.  Platelets-138.  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   No crisis continue folic acid.  # Thrombocytopenia- platelets 138; splenomegaly versus others.  STABLE.   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  Do not suspect any liver disease.  DISPOSITION: # Follow up- MD in 1 year/ labs-cbc/cmp/[facility]-Dr.B  Follow-up instructions:  I discussed the assessment and treatment plan with the patient.  The patient was provided an opportunity to ask questions and all were answered.  The patient agreed with the plan and demonstrated understanding of instructions.  The patient was advised to call back or seek an in person evaluation if the symptoms worsen or if the  condition fails to improve as anticipated.  Dr. Louretta Shorten [facility] at [facility] 03/07/2020 1:59 PM

## 2020-03-07 NOTE — Assessment & Plan Note (Addendum)
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   No crisis continue folic acid.  # Thrombocytopenia- platelets 138; splenomegaly versus others.  STABLE.   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  Do not suspect any liver disease.  DISPOSITION: # Follow up- MD in 1 year/ labs-cbc/cmp/LDH-Dr.B

## 2020-04-12 ENCOUNTER — Other Ambulatory Visit: Payer: Self-pay

## 2020-04-12 DIAGNOSIS — D649 Anemia, unspecified: Secondary | ICD-10-CM

## 2020-04-12 NOTE — Progress Notes (Signed)
[facility]

## 2020-04-12 NOTE — Progress Notes (Signed)
Error

## 2020-04-14 ENCOUNTER — Other Ambulatory Visit: Payer: BC Managed Care – PPO

## 2020-04-14 ENCOUNTER — Other Ambulatory Visit: Payer: Self-pay

## 2020-04-14 DIAGNOSIS — Z0000 Encounter for general adult medical examination without abnormal findings: Secondary | ICD-10-CM

## 2020-04-14 DIAGNOSIS — E119 Type 2 diabetes mellitus without complications: Secondary | ICD-10-CM

## 2020-04-14 DIAGNOSIS — Z794 Long term (current) use of insulin: Secondary | ICD-10-CM

## 2020-04-15 LAB — BASIC METABOLIC PANEL
BUN/Creatinine Ratio: 13 (ref 9–20)
BUN: 12 mg/dL (ref 6–24)
CO2: 23 mmol/L (ref 20–29)
Calcium: 9.9 mg/dL (ref 8.7–10.2)
Chloride: 102 mmol/L (ref 96–106)
Creatinine, Ser: 0.92 mg/dL (ref 0.76–1.27)
GFR calc Af Amer: 106 mL/min/{1.73_m2} (ref >59)
GFR calc non Af Amer: 91 mL/min/{1.73_m2} (ref >59)
Glucose: 172 mg/dL — ABNORMAL HIGH (ref 65–99)
Potassium: 4.9 mmol/L (ref 3.5–5.2)
Sodium: 143 mmol/L (ref 134–144)

## 2020-04-15 LAB — CBC WITH DIFFERENTIAL/PLATELET
Basophils Absolute: 0.1 10*3/uL (ref 0.0–0.2)
Basos: 1 %
EOS (ABSOLUTE): 0.1 10*3/uL (ref 0.0–0.4)
Eos: 1 %
Hematocrit: 51.3 % — ABNORMAL HIGH (ref 37.5–51.0)
Hemoglobin: 17.1 g/dL (ref 13.0–17.7)
Immature Grans (Abs): 0 10*3/uL (ref 0.0–0.1)
Immature Granulocytes: 1 %
Lymphocytes Absolute: 1.6 10*3/uL (ref 0.7–3.1)
Lymphs: 22 %
MCH: 29.3 pg (ref 26.6–33.0)
MCHC: 33.3 g/dL (ref 31.5–35.7)
MCV: 88 fL (ref 79–97)
Monocytes Absolute: 0.5 10*3/uL (ref 0.1–0.9)
Monocytes: 7 %
Neutrophils Absolute: 4.9 10*3/uL (ref 1.4–7.0)
Neutrophils: 68 %
Platelets: 150 10*3/uL (ref 150–450)
RBC: 5.84 x10E6/uL — ABNORMAL HIGH (ref 4.14–5.80)
RDW: 13.6 % (ref 11.6–15.4)
WBC: 7.1 10*3/uL (ref 3.4–10.8)

## 2020-04-15 LAB — HEPATIC FUNCTION PANEL
ALT: 36 IU/L (ref 0–44)
AST: 29 IU/L (ref 0–40)
Albumin: 4.7 g/dL (ref 3.8–4.9)
Alkaline Phosphatase: 48 IU/L (ref 44–121)
Bilirubin Total: 2.1 mg/dL — ABNORMAL HIGH (ref 0.0–1.2)
Bilirubin, Direct: 0.32 mg/dL (ref 0.00–0.40)
Total Protein: 7.6 g/dL (ref 6.0–8.5)

## 2020-04-15 LAB — LIPID PANEL
Chol/HDL Ratio: 5.8 ratio — ABNORMAL HIGH (ref 0.0–5.0)
Cholesterol, Total: 190 mg/dL (ref 100–199)
HDL: 33 mg/dL — ABNORMAL LOW (ref >39)
LDL Chol Calc (NIH): 122 mg/dL — ABNORMAL HIGH (ref 0–99)
Triglycerides: 195 mg/dL — ABNORMAL HIGH (ref 0–149)
VLDL Cholesterol Cal: 35 mg/dL (ref 5–40)

## 2020-04-15 LAB — URINALYSIS, ROUTINE W REFLEX MICROSCOPIC
Bilirubin, UA: NEGATIVE
Ketones, UA: NEGATIVE
Leukocytes,UA: NEGATIVE
Nitrite, UA: NEGATIVE
Protein,UA: NEGATIVE
RBC, UA: NEGATIVE
Specific Gravity, UA: 1.024 (ref 1.005–1.030)
Urobilinogen, Ur: 0.2 mg/dL (ref 0.2–1.0)
pH, UA: 6 (ref 5.0–7.5)

## 2020-04-15 LAB — HGB A1C W/O EAG: Hgb A1c MFr Bld: 6.6 % — ABNORMAL HIGH (ref 4.8–5.6)

## 2020-04-15 LAB — PSA: Prostate Specific Ag, Serum: 0.7 ng/mL (ref 0.0–4.0)

## 2020-04-15 LAB — FRUCTOSAMINE: Fructosamine: 339 umol/L — ABNORMAL HIGH (ref 0–285)

## 2020-08-03 ENCOUNTER — Other Ambulatory Visit: Payer: Self-pay | Admitting: Internal Medicine

## 2020-08-31 ENCOUNTER — Other Ambulatory Visit: Payer: BC Managed Care – PPO

## 2020-08-31 ENCOUNTER — Other Ambulatory Visit: Payer: Self-pay

## 2020-08-31 DIAGNOSIS — E1169 Type 2 diabetes mellitus with other specified complication: Secondary | ICD-10-CM

## 2020-08-31 DIAGNOSIS — Z794 Long term (current) use of insulin: Secondary | ICD-10-CM

## 2020-08-31 DIAGNOSIS — E559 Vitamin D deficiency, unspecified: Secondary | ICD-10-CM

## 2020-08-31 DIAGNOSIS — E119 Type 2 diabetes mellitus without complications: Secondary | ICD-10-CM

## 2020-08-31 DIAGNOSIS — E785 Hyperlipidemia, unspecified: Secondary | ICD-10-CM

## 2020-09-01 LAB — LIPID PANEL
Chol/HDL Ratio: 3.4 ratio (ref 0.0–5.0)
Cholesterol, Total: 119 mg/dL (ref 100–199)
HDL: 35 mg/dL — ABNORMAL LOW (ref >39)
LDL Chol Calc (NIH): 63 mg/dL (ref 0–99)
Triglycerides: 117 mg/dL (ref 0–149)
VLDL Cholesterol Cal: 21 mg/dL (ref 5–40)

## 2020-09-01 LAB — COMPREHENSIVE METABOLIC PANEL
ALT: 32 IU/L (ref 0–44)
AST: 29 IU/L (ref 0–40)
Albumin/Globulin Ratio: 2.4 — ABNORMAL HIGH (ref 1.2–2.2)
Albumin: 5.2 g/dL — ABNORMAL HIGH (ref 3.8–4.9)
Alkaline Phosphatase: 48 IU/L (ref 44–121)
BUN/Creatinine Ratio: 14 (ref 9–20)
BUN: 12 mg/dL (ref 6–24)
Bilirubin Total: 2.2 mg/dL — ABNORMAL HIGH (ref 0.0–1.2)
CO2: 24 mmol/L (ref 20–29)
Calcium: 9.4 mg/dL (ref 8.7–10.2)
Chloride: 100 mmol/L (ref 96–106)
Creatinine, Ser: 0.84 mg/dL (ref 0.76–1.27)
Globulin, Total: 2.2 g/dL (ref 1.5–4.5)
Glucose: 151 mg/dL — ABNORMAL HIGH (ref 65–99)
Potassium: 4 mmol/L (ref 3.5–5.2)
Sodium: 142 mmol/L (ref 134–144)
Total Protein: 7.4 g/dL (ref 6.0–8.5)
eGFR: 101 mL/min/{1.73_m2} (ref >59)

## 2020-09-01 LAB — MICROALBUMIN / CREATININE URINE RATIO
Creatinine, Urine: 119.1 mg/dL
Microalb/Creat Ratio: 9 mg/g creat (ref 0–29)
Microalbumin, Urine: 10.3 ug/mL

## 2020-09-01 LAB — TSH: TSH: 2 u[IU]/mL (ref 0.450–4.500)

## 2020-09-01 LAB — HGB A1C W/O EAG: Hgb A1c MFr Bld: 6.6 % — ABNORMAL HIGH (ref 4.8–5.6)

## 2020-09-01 LAB — VITAMIN D 25 HYDROXY (VIT D DEFICIENCY, FRACTURES): Vit D, 25-Hydroxy: 63.9 ng/mL (ref 30.0–100.0)

## 2021-01-23 ENCOUNTER — Other Ambulatory Visit: Payer: BC Managed Care – PPO

## 2021-01-24 ENCOUNTER — Other Ambulatory Visit: Payer: BC Managed Care – PPO

## 2021-01-24 ENCOUNTER — Other Ambulatory Visit: Payer: Self-pay

## 2021-01-24 DIAGNOSIS — Z794 Long term (current) use of insulin: Secondary | ICD-10-CM

## 2021-01-24 DIAGNOSIS — E119 Type 2 diabetes mellitus without complications: Secondary | ICD-10-CM

## 2021-01-25 LAB — HGB A1C W/O EAG: Hgb A1c MFr Bld: 6.6 % — ABNORMAL HIGH (ref 4.8–5.6)

## 2021-01-25 LAB — FRUCTOSAMINE: Fructosamine: 314 umol/L — ABNORMAL HIGH (ref 0–285)

## 2021-02-13 ENCOUNTER — Telehealth: Payer: Self-pay | Admitting: Internal Medicine

## 2021-02-13 ENCOUNTER — Other Ambulatory Visit: Payer: Self-pay

## 2021-02-13 DIAGNOSIS — D580 Hereditary spherocytosis: Secondary | ICD-10-CM

## 2021-02-13 NOTE — Telephone Encounter (Signed)
Lab orders faxed to [facility] at [phone]

## 2021-02-13 NOTE — Telephone Encounter (Signed)
He needs lab sheet faxed to his work at [employer] to do before appt

## 2021-02-13 NOTE — Telephone Encounter (Signed)
Pt called to reschedule his appt for 03-07-21. Call back at [phone]

## 2021-02-15 ENCOUNTER — Other Ambulatory Visit: Payer: Self-pay

## 2021-02-15 ENCOUNTER — Other Ambulatory Visit: Payer: BC Managed Care – PPO

## 2021-02-15 DIAGNOSIS — D580 Hereditary spherocytosis: Secondary | ICD-10-CM

## 2021-02-15 NOTE — Progress Notes (Signed)
[facility]

## 2021-02-16 LAB — CBC WITH DIFFERENTIAL/PLATELET
Basophils Absolute: 0.1 10*3/uL (ref 0.0–0.2)
Basos: 1 %
EOS (ABSOLUTE): 0.2 10*3/uL (ref 0.0–0.4)
Eos: 2 %
Hematocrit: 50.5 % (ref 37.5–51.0)
Hemoglobin: 16.8 g/dL (ref 13.0–17.7)
Immature Grans (Abs): 0.1 10*3/uL (ref 0.0–0.1)
Immature Granulocytes: 1 %
Lymphocytes Absolute: 2 10*3/uL (ref 0.7–3.1)
Lymphs: 25 %
MCH: 28.7 pg (ref 26.6–33.0)
MCHC: 33.3 g/dL (ref 31.5–35.7)
MCV: 86 fL (ref 79–97)
Monocytes Absolute: 0.6 10*3/uL (ref 0.1–0.9)
Monocytes: 7 %
Neutrophils Absolute: 5.1 10*3/uL (ref 1.4–7.0)
Neutrophils: 64 %
Platelets: 151 10*3/uL (ref 150–450)
RBC: 5.85 x10E6/uL — ABNORMAL HIGH (ref 4.14–5.80)
RDW: 13.3 % (ref 11.6–15.4)
WBC: 7.9 10*3/uL (ref 3.4–10.8)

## 2021-02-16 LAB — COMPREHENSIVE METABOLIC PANEL
ALT: 32 IU/L (ref 0–44)
AST: 23 IU/L (ref 0–40)
Albumin/Globulin Ratio: 2.3 — ABNORMAL HIGH (ref 1.2–2.2)
Albumin: 5.1 g/dL — ABNORMAL HIGH (ref 3.8–4.9)
Alkaline Phosphatase: 46 IU/L (ref 44–121)
BUN/Creatinine Ratio: 14 (ref 9–20)
BUN: 13 mg/dL (ref 6–24)
Bilirubin Total: 1.8 mg/dL — ABNORMAL HIGH (ref 0.0–1.2)
CO2: 25 mmol/L (ref 20–29)
Calcium: 9.8 mg/dL (ref 8.7–10.2)
Chloride: 102 mmol/L (ref 96–106)
Creatinine, Ser: 0.9 mg/dL (ref 0.76–1.27)
Globulin, Total: 2.2 g/dL (ref 1.5–4.5)
Glucose: 120 mg/dL — ABNORMAL HIGH (ref 70–99)
Potassium: 5.3 mmol/L — ABNORMAL HIGH (ref 3.5–5.2)
Sodium: 142 mmol/L (ref 134–144)
Total Protein: 7.3 g/dL (ref 6.0–8.5)
eGFR: 99 mL/min/{1.73_m2} (ref >59)

## 2021-02-28 ENCOUNTER — Inpatient Hospital Stay: Payer: BC Managed Care – PPO | Attending: Internal Medicine | Admitting: Internal Medicine

## 2021-02-28 ENCOUNTER — Encounter: Payer: Self-pay | Admitting: Internal Medicine

## 2021-02-28 ENCOUNTER — Other Ambulatory Visit: Payer: Self-pay

## 2021-02-28 DIAGNOSIS — D580 Hereditary spherocytosis: Secondary | ICD-10-CM | POA: Diagnosis present

## 2021-02-28 DIAGNOSIS — R040 Epistaxis: Secondary | ICD-10-CM | POA: Insufficient documentation

## 2021-02-28 DIAGNOSIS — R5383 Other fatigue: Secondary | ICD-10-CM | POA: Insufficient documentation

## 2021-02-28 DIAGNOSIS — E119 Type 2 diabetes mellitus without complications: Secondary | ICD-10-CM | POA: Insufficient documentation

## 2021-02-28 DIAGNOSIS — D696 Thrombocytopenia, unspecified: Secondary | ICD-10-CM | POA: Insufficient documentation

## 2021-02-28 DIAGNOSIS — R161 Splenomegaly, not elsewhere classified: Secondary | ICD-10-CM | POA: Insufficient documentation

## 2021-02-28 NOTE — Progress Notes (Signed)
Pt concerned with nose bleeds. Has been having them since Christmas. The last one lasted over an hour.  Getting leg/feet cramps, mainly qhs.

## 2021-02-28 NOTE — Patient Instructions (Signed)
#   recommend oxymetazoline 1 -2 puff/ in 24 hours; do not use for more than 3 days.  #Recommend nasal saline spray each nostril/once or twice a day; or Vaseline in the nostrils once or twice a day.

## 2021-02-28 NOTE — Progress Notes (Signed)
[facility] [place] OFFICE PROGRESS NOTE  Patient Care Team: [place] Pink, MD as PCP - General (Family Medicine)   Cancer Staging  No matching staging information was found for the patient.  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  Peripheral blood smear reveals rare spherocytes. 4. Labs 01-10-07. HIV negative, HCV antibody negative, hepatitis-B antigen and antibody negative, ANA 42 negative, folic acid and vitamin B12 within normal limits, SPEP normal IFE pattern. Normal levels of Immunoglobulins. Total bilirubin 2.3, direct bilirubin 0.2, LDH 143 Coombs direct and indirect negative. 6.  PNH screen 02-13-07. No flow cytometric evidence of PNH ?? Molecular testing for Hereditary spherocytosis.   #Thrombocytopenia- 2. Ultrasound of abdomen general survey on 01-14-07. Cholelithiasis, probable fatty infiltration of liver, splenomegaly ;  CT abdomen and pelvis with contrast 01-24-07. Borderline splenomegaly. The portal vein is patent. No varices are noted. No evidence of retroperitoneal adenopathy.  # Splenomegaly/ fatty liver- 10/ 03/01/09: ultrasound spleen:Persistent splenomegaly, stable as compared to a prior exam of February 16, 2008; 2017 NOV [place]- Splenomeglay- slightly increased volume to sep 2016.   # IDDM-  .   Oncology History   No history exists.      INTERVAL HISTORY:  Gary Garcia. 61 y.o.  male pleasant patient above history of hereditary spherocytosis; splenomegaly and also thrombocytopenia is here for follow-up.   Patient has intermittent nosebleeds more so in the last few weeks.  Both nostrils.  He denies any left upper quadrant pain nausea vomiting.  Complains of fatigue.    Review of Systems  Constitutional:  Positive for malaise/fatigue. Negative for chills, diaphoresis, fever and weight loss.  HENT:   Negative for nosebleeds and sore throat.   Eyes:  Negative for double vision.  Respiratory:  Negative for cough, hemoptysis, sputum production, shortness of breath and wheezing.   Cardiovascular:  Negative for chest pain, palpitations, orthopnea and leg swelling.  Gastrointestinal:  Negative for abdominal pain, blood in stool, constipation, diarrhea, heartburn, melena, nausea and vomiting.  Genitourinary:  Negative for dysuria, frequency and urgency.  Musculoskeletal:  Negative for back pain and joint pain.  Skin: Negative.  Negative for itching and rash.  Neurological:  Negative for dizziness, tingling, focal weakness, weakness and headaches.  Endo/Heme/Allergies:  Does not bruise/bleed easily.  Psychiatric/Behavioral:  Negative for depression. The patient is not nervous/anxious and does not have insomnia.     PAST MEDICAL HISTORY :  Past Medical History:  Diagnosis Date   B12 deficiency    Diabetes ([place])    Vitamin D deficiency      PAST SURGICAL HISTORY :   Past Surgical History:  Procedure Laterality Date   CHOLECYSTECTOMY      FAMILY HISTORY :   Family History  Problem Relation Age of Onset   Diabetes Father    Heart attack Father    Other Mother        Amyloidosis    No family history of heart disease.  SOCIAL HISTORY:   Social History   Tobacco Use   Smoking status: Never   Smokeless tobacco: Never  Vaping Use   Vaping Use: Never used  Substance Use Topics   Alcohol use: Never    Alcohol/week: 0.0 standard drinks   Drug use: Never    ALLERGIES:  has No Known Allergies.  MEDICATIONS:  Current Outpatient Medications  Medication Sig Dispense Refill  Cholecalciferol (VITAMIN D3) 1000 UNITS CAPS Take 1 capsule (1,000 Units total) by mouth 2 (two) times daily. 60 capsule    Cyanocobalamin (RA VITAMIN B-12 TR) 1000 MCG TBCR Take 1 tablet (1,000 mcg total) by mouth 2 (two) times daily.     folic acid (FOLVITE) 1 MG tablet TAKE 1 TABLET BY MOUTH EVERY DAY 90  tablet 4   Insulin Disposable Pump (V-GO 40) KIT USE AS DIRECTED.     Insulin Pen Needle 32G X 4 MM MISC USE ONE PEN NEEDLE FOUR TIMES A DAY AS DIRECTED WITH NOVOLOG AND LANTUS PENS     NOVOLOG 100 UNIT/ML injection 76 Units. Insulin  pump  2   omega-3 acid ethyl esters (LOVAZA) 1 G capsule Take by mouth.     ONE TOUCH ULTRA TEST test strip USE FOUR TIMES A DAY AS DIRECTED  1   rosuvastatin (CRESTOR) 5 MG tablet Take 5 mg by mouth daily.     XIGDUO XR 06-998 MG TB24 Take 2 tablets by mouth every morning.     canagliflozin (INVOKANA) 300 MG TABS tablet Take 1 tablet by mouth daily.     metFORMIN (GLUCOPHAGE) 1000 MG tablet TAKE ONE TABLET BY MOUTH TWICE A DAY AS DIRECTED     No current facility-administered medications for this visit.    PHYSICAL EXAMINATION: ECOG PERFORMANCE STATUS: 0 - Asymptomatic  BP (!) 160/91 (BP Location: Right Arm, Patient Position: Sitting, Cuff Size: Normal)    Pulse 89    Temp (!) 96.7 F (35.9 C) (Tympanic)    Wt 212 lb 3.2 oz (96.3 kg)    SpO2 99%    BMI 29.60 kg/m   Filed Weights   02/28/21 1456  Weight: 212 lb 3.2 oz (96.3 kg)    Physical Exam HENT:     Head: Normocephalic and atraumatic.     Mouth/Throat:     Pharynx: No oropharyngeal exudate.  Eyes:     Pupils: Pupils are equal, round, and reactive to light.  Cardiovascular:     Rate and Rhythm: Normal rate and regular rhythm.  Pulmonary:     Effort: No respiratory distress.     Breath sounds: No wheezing.  Abdominal:     General: Bowel sounds are normal. There is no distension.     Palpations: Abdomen is soft. There is no mass.     Tenderness: There is no abdominal tenderness. There is no guarding or rebound.  Musculoskeletal:        General: No tenderness. Normal range of motion.     Cervical back: Normal range of motion and neck supple.  Skin:    General: Skin is warm.  Neurological:     Mental Status: He is alert and oriented to person, place, and time.  Psychiatric:        Mood  and Affect: Affect normal.    LABORATORY DATA:  I have reviewed the data as listed    Component Value Date/Time   NA 142 02/15/2021 0812   K 5.3 (H) 02/15/2021 0812   CL 102 02/15/2021 0812   CO2 25 02/15/2021 0812   GLUCOSE 120 (H) 02/15/2021 0812   BUN 13 02/15/2021 0812   CREATININE 0.90 02/15/2021 0812   CALCIUM 9.8 02/15/2021 0812   PROT 7.3 02/15/2021 0812   ALBUMIN 5.1 (H) 02/15/2021 0812   AST 23 02/15/2021 0812   ALT 32 02/15/2021 0812   ALKPHOS 46 02/15/2021 0812   BILITOT 1.8 (H) 02/15/2021 0812   GFRNONAA 91  04/14/2020 0848   GFRAA 106 04/14/2020 0848    No results found for: SPEP, UPEP  Lab Results  Component Value Date   WBC 7.9 02/15/2021   NEUTROABS 5.1 02/15/2021   HGB 16.8 02/15/2021   HCT 50.5 02/15/2021   MCV 86 02/15/2021   PLT 151 02/15/2021      Chemistry      Component Value Date/Time   NA 142 02/15/2021 0812   K 5.3 (H) 02/15/2021 0812   CL 102 02/15/2021 0812   CO2 25 02/15/2021 0812   BUN 13 02/15/2021 0812   CREATININE 0.90 02/15/2021 0812      Component Value Date/Time   CALCIUM 9.8 02/15/2021 0812   ALKPHOS 46 02/15/2021 0812   AST 23 02/15/2021 0812   ALT 32 02/15/2021 0812   BILITOT 1.8 (H) 02/15/2021 6599       RADIOGRAPHIC STUDIES: I have personally reviewed the radiological images as listed and agreed with the findings in the report. No results found.   ASSESSMENT & PLAN:  Hereditary spherocytosis (Gary Garcia) # Hereditary spherecytosis- [previous workup at [facility]; interestingly no family history]; hemoglobin- STABLE.   No crisis continue folic acid.  # Thrombocytopenia- platelets 158; splenomegaly versus others.  STABLE.   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  1 hour-recommend oxymetazoline sprays as needed for nosebleed.  To prevent nosebleed recommend moisturizing nasal saline spray/Vaseline.  If continues to recur would  recommend evaluation with ENT.  # Fatigue: Recommend exercise. A1c ~6 [as per pt]  DISPOSITION: # Follow up- MD in 1 year/ labs-cbc/cmp/LDH-Dr.B   Orders Placed This Encounter  Procedures   CBC with Differential/Platelet    Standing Status:   Future    Standing Expiration Date:   02/28/2022   Comprehensive metabolic panel    Standing Status:   Future    Standing Expiration Date:   02/28/2022   Lactate dehydrogenase    Standing Status:   Future    Standing Expiration Date:   02/28/2022   All questions were answered. The patient knows to call the clinic with any problems, questions or concerns.      Cammie Sickle, MD 02/28/2021 3:45 PM

## 2021-02-28 NOTE — Assessment & Plan Note (Addendum)
#   Hereditary spherecytosis- [previous workup at [facility]; interestingly no family history]; hemoglobin- STABLE.   No crisis continue folic acid.  # Thrombocytopenia- platelets 158; splenomegaly versus others.  STABLE.   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  1 hour-recommend oxymetazoline sprays as needed for nosebleed.  To prevent nosebleed recommend moisturizing nasal saline spray/Vaseline.  If continues to recur would recommend evaluation with ENT.  # Fatigue: Recommend exercise. A1c ~6 [as per pt]  DISPOSITION: # Follow up- MD in 1 year/ labs-cbc/cmp/LDH-Dr.B

## 2021-03-07 ENCOUNTER — Inpatient Hospital Stay: Payer: BC Managed Care – PPO

## 2021-03-07 ENCOUNTER — Ambulatory Visit: Payer: BC Managed Care – PPO | Admitting: Internal Medicine

## 2021-03-10 NOTE — Telephone Encounter (Signed)
error 

## 2021-07-05 ENCOUNTER — Other Ambulatory Visit: Payer: BC Managed Care – PPO

## 2021-07-05 DIAGNOSIS — E119 Type 2 diabetes mellitus without complications: Secondary | ICD-10-CM

## 2021-07-05 DIAGNOSIS — Z794 Long term (current) use of insulin: Secondary | ICD-10-CM

## 2021-07-06 LAB — HGB A1C W/O EAG: Hgb A1c MFr Bld: 6.7 % — ABNORMAL HIGH (ref 4.8–5.6)

## 2021-10-02 ENCOUNTER — Other Ambulatory Visit: Payer: Self-pay | Admitting: Internal Medicine

## 2021-11-20 ENCOUNTER — Other Ambulatory Visit: Payer: Self-pay

## 2021-11-20 DIAGNOSIS — E1169 Type 2 diabetes mellitus with other specified complication: Secondary | ICD-10-CM

## 2021-11-20 DIAGNOSIS — Z794 Long term (current) use of insulin: Secondary | ICD-10-CM

## 2021-11-20 DIAGNOSIS — E559 Vitamin D deficiency, unspecified: Secondary | ICD-10-CM

## 2021-11-20 DIAGNOSIS — Z79899 Other long term (current) drug therapy: Secondary | ICD-10-CM

## 2021-11-20 DIAGNOSIS — E1100 Type 2 diabetes mellitus with hyperosmolarity without nonketotic hyperglycemic-hyperosmolar coma (NKHHC): Secondary | ICD-10-CM

## 2021-11-21 LAB — MICROALBUMIN / CREATININE URINE RATIO
Creatinine, Urine: 133.7 mg/dL
Microalb/Creat Ratio: 10 mg/g creat (ref 0–29)
Microalbumin, Urine: 12.9 ug/mL

## 2021-11-22 LAB — COMPREHENSIVE METABOLIC PANEL
ALT: 24 IU/L (ref 0–44)
AST: 21 IU/L (ref 0–40)
Albumin/Globulin Ratio: 2.2 (ref 1.2–2.2)
Albumin: 4.9 g/dL (ref 3.8–4.9)
Alkaline Phosphatase: 40 IU/L — ABNORMAL LOW (ref 44–121)
BUN/Creatinine Ratio: 18 (ref 9–20)
BUN: 14 mg/dL (ref 6–24)
Bilirubin Total: 1.6 mg/dL — ABNORMAL HIGH (ref 0.0–1.2)
CO2: 22 mmol/L (ref 20–29)
Calcium: 9.7 mg/dL (ref 8.7–10.2)
Chloride: 101 mmol/L (ref 96–106)
Creatinine, Ser: 0.78 mg/dL (ref 0.76–1.27)
Globulin, Total: 2.2 g/dL (ref 1.5–4.5)
Glucose: 136 mg/dL — ABNORMAL HIGH (ref 70–99)
Potassium: 4.4 mmol/L (ref 3.5–5.2)
Sodium: 139 mmol/L (ref 134–144)
Total Protein: 7.1 g/dL (ref 6.0–8.5)
eGFR: 103 mL/min/{1.73_m2} (ref >59)

## 2021-11-22 LAB — VITAMIN B12: Vitamin B-12: 1617 pg/mL — ABNORMAL HIGH (ref 232–1245)

## 2021-11-22 LAB — LIPID PANEL
Chol/HDL Ratio: 3.8 ratio (ref 0.0–5.0)
Cholesterol, Total: 125 mg/dL (ref 100–199)
HDL: 33 mg/dL — ABNORMAL LOW (ref >39)
LDL Chol Calc (NIH): 69 mg/dL (ref 0–99)
Triglycerides: 127 mg/dL (ref 0–149)
VLDL Cholesterol Cal: 23 mg/dL (ref 5–40)

## 2021-11-22 LAB — TSH: TSH: 2.22 u[IU]/mL (ref 0.450–4.500)

## 2021-11-22 LAB — VITAMIN D 25 HYDROXY (VIT D DEFICIENCY, FRACTURES): Vit D, 25-Hydroxy: 53.1 ng/mL (ref 30.0–100.0)

## 2021-11-22 LAB — HGB A1C W/O EAG: Hgb A1c MFr Bld: 6.4 % — ABNORMAL HIGH (ref 4.8–5.6)

## 2022-02-28 ENCOUNTER — Ambulatory Visit: Payer: BC Managed Care – PPO | Admitting: Internal Medicine

## 2022-03-16 ENCOUNTER — Encounter: Payer: Self-pay | Admitting: Internal Medicine

## 2022-03-20 ENCOUNTER — Other Ambulatory Visit: Payer: Self-pay

## 2022-03-20 DIAGNOSIS — D580 Hereditary spherocytosis: Secondary | ICD-10-CM

## 2022-03-22 LAB — COMPREHENSIVE METABOLIC PANEL
ALT: 27 IU/L (ref 0–44)
AST: 26 IU/L (ref 0–40)
Albumin/Globulin Ratio: 1.9 (ref 1.2–2.2)
Albumin: 4.7 g/dL (ref 3.8–4.9)
Alkaline Phosphatase: 42 IU/L — ABNORMAL LOW (ref 44–121)
BUN/Creatinine Ratio: 16 (ref 10–24)
BUN: 13 mg/dL (ref 8–27)
Bilirubin Total: 2.1 mg/dL — ABNORMAL HIGH (ref 0.0–1.2)
CO2: 23 mmol/L (ref 20–29)
Calcium: 9.6 mg/dL (ref 8.6–10.2)
Chloride: 101 mmol/L (ref 96–106)
Creatinine, Ser: 0.81 mg/dL (ref 0.76–1.27)
Globulin, Total: 2.5 g/dL (ref 1.5–4.5)
Glucose: 136 mg/dL — ABNORMAL HIGH (ref 70–99)
Potassium: 4.8 mmol/L (ref 3.5–5.2)
Sodium: 141 mmol/L (ref 134–144)
Total Protein: 7.2 g/dL (ref 6.0–8.5)
eGFR: 101 mL/min/{1.73_m2} (ref >59)

## 2022-03-22 LAB — CBC WITH DIFFERENTIAL/PLATELET
Basophils Absolute: 0.1 10*3/uL (ref 0.0–0.2)
Basos: 1 %
EOS (ABSOLUTE): 0.1 10*3/uL (ref 0.0–0.4)
Eos: 2 %
Hematocrit: 48.3 % (ref 37.5–51.0)
Hemoglobin: 16.7 g/dL (ref 13.0–17.7)
Immature Grans (Abs): 0 10*3/uL (ref 0.0–0.1)
Immature Granulocytes: 0 %
Lymphocytes Absolute: 2.1 10*3/uL (ref 0.7–3.1)
Lymphs: 27 %
MCH: 29.6 pg (ref 26.6–33.0)
MCHC: 34.6 g/dL (ref 31.5–35.7)
MCV: 86 fL (ref 79–97)
Monocytes Absolute: 0.5 10*3/uL (ref 0.1–0.9)
Monocytes: 7 %
Neutrophils Absolute: 4.8 10*3/uL (ref 1.4–7.0)
Neutrophils: 63 %
Platelets: 150 10*3/uL (ref 150–450)
RBC: 5.64 x10E6/uL (ref 4.14–5.80)
RDW: 13.4 % (ref 11.6–15.4)
WBC: 7.6 10*3/uL (ref 3.4–10.8)

## 2022-03-22 LAB — LACTATE DEHYDROGENASE: LDH: 168 IU/L (ref 121–224)

## 2022-03-27 ENCOUNTER — Inpatient Hospital Stay: Payer: BC Managed Care – PPO | Attending: Internal Medicine | Admitting: Internal Medicine

## 2022-03-27 VITALS — BP 148/66 | HR 79 | Temp 97.3°F | Resp 18 | Wt 215.8 lb

## 2022-03-27 DIAGNOSIS — D696 Thrombocytopenia, unspecified: Secondary | ICD-10-CM | POA: Insufficient documentation

## 2022-03-27 DIAGNOSIS — R161 Splenomegaly, not elsewhere classified: Secondary | ICD-10-CM | POA: Insufficient documentation

## 2022-03-27 DIAGNOSIS — Z794 Long term (current) use of insulin: Secondary | ICD-10-CM | POA: Insufficient documentation

## 2022-03-27 DIAGNOSIS — D580 Hereditary spherocytosis: Secondary | ICD-10-CM | POA: Insufficient documentation

## 2022-03-27 DIAGNOSIS — E119 Type 2 diabetes mellitus without complications: Secondary | ICD-10-CM | POA: Diagnosis not present

## 2022-03-27 NOTE — Assessment & Plan Note (Signed)
#  Hereditary spherecytosis- [previous workup at [facility]; interestingly no family history]; hemoglobin- stable. No crisis is noted.  Continue folic acid.  # Thrombocytopenia- platelets 158; splenomegaly versus others. Stable.   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   # Elevated B12 levels- recommend B12 1 pill intead of 2 pills.   # IDDM/Dr.OConnell- stable.   #Since patient is clinically stable I think is reasonable for the patient to follow-up with PCP/can follow-up with [place] as needed.  Patient comfortable with the plan; to call us if any questions or concerns in the interim.  DISPOSITION: # Follow up as needed- Dr.B

## 2022-03-27 NOTE — Progress Notes (Unsigned)
[place] OFFICE PROGRESS NOTE  Patient Care Team: Maryland Pink, MD as PCP - General (Family Medicine)   Cancer Staging  No matching staging information was found for the patient.  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  Peripheral blood smear reveals rare spherocytes. 4. Labs 01-10-07. HIV negative, HCV antibody negative, hepatitis-B antigen and antibody negative, ANA 42 negative, folic acid and vitamin B12 within normal limits, SPEP normal IFE pattern. Normal levels of Immunoglobulins. Total bilirubin 2.3, direct bilirubin 0.2, LDH 143 Coombs direct and indirect negative. 6.  PNH screen 02-13-07. No flow cytometric evidence of PNH ?? Molecular testing for Hereditary spherocytosis.   #Thrombocytopenia- 2. Ultrasound of abdomen general survey on 01-14-07. Cholelithiasis, probable fatty infiltration of liver, splenomegaly ;  CT abdomen and pelvis with contrast 01-24-07. Borderline splenomegaly. The portal vein is patent. No varices are noted. No evidence of retroperitoneal adenopathy.  # Splenomegaly/ fatty liver- 10/ 03/01/09: ultrasound spleen:Persistent splenomegaly, stable as compared to a prior exam of February 16, 2008; 2017 NOV [place]- Splenomeglay- slightly increased volume to sep 2016.   # IDDM-  .   Oncology History   No history exists.     INTERVAL HISTORY: Ambulating independently.  Alone.  Gary Garcia 62 y.o.  male pleasant patient above history of hereditary spherocytosis; splenomegaly and also thrombocytopenia is here for follow-up.   Doing well. No bruising or visible bleeding. Appetite is good. Energy is lower waxes and wanes. Wears a diabetes pump.   He denies any left upper quadrant pain nausea vomiting.    Review of Systems  Constitutional:  Negative for chills, diaphoresis, fever and weight loss.   HENT:  Negative for nosebleeds and sore throat.   Eyes:  Negative for double vision.  Respiratory:  Negative for cough, hemoptysis, sputum production, shortness of breath and wheezing.   Cardiovascular:  Negative for chest pain, palpitations, orthopnea and leg swelling.  Gastrointestinal:  Negative for abdominal pain, blood in stool, constipation, diarrhea, heartburn, melena, nausea and vomiting.  Genitourinary:  Negative for dysuria, frequency and urgency.  Musculoskeletal:  Negative for back pain and joint pain.  Skin: Negative.  Negative for itching and rash.  Neurological:  Negative for dizziness, tingling, focal weakness, weakness and headaches.  Endo/Heme/Allergies:  Does not bruise/bleed easily.  Psychiatric/Behavioral:  Negative for depression. The patient is not nervous/anxious and does not have insomnia.      PAST MEDICAL HISTORY :  Past Medical History:  Diagnosis Date   B12 deficiency    Diabetes ([place])    Vitamin D deficiency      PAST SURGICAL HISTORY :   Past Surgical History:  Procedure Laterality Date   CHOLECYSTECTOMY      FAMILY HISTORY :   Family History  Problem Relation Garcia of Onset   Diabetes Father    Heart attack Father    Other Mother        Amyloidosis    No family history of heart disease.  SOCIAL HISTORY:   Social History   Tobacco Use   Smoking status: Never   Smokeless tobacco: Never  Vaping Use   Vaping Use: Never used  Substance Use Topics   Alcohol use: Never    Alcohol/week: 0.0 standard drinks of alcohol   Drug use: Never    ALLERGIES:  has No Known Allergies.  MEDICATIONS:  Current Outpatient Medications  Medication Sig Dispense Refill   Cholecalciferol (VITAMIN D3) 1000 UNITS CAPS Take 1 capsule (1,000 Units total) by mouth 2 (two) times daily. 60 capsule    Cyanocobalamin (RA VITAMIN B-12 TR) 1000 MCG TBCR Take 1 tablet (1,000 mcg total) by mouth 2 (two) times daily.     folic acid (FOLVITE) 1 MG tablet TAKE 1 TABLET BY  MOUTH EVERY DAY 90 tablet 4   Insulin Disposable Pump (V-GO 40) KIT USE AS DIRECTED.     Insulin Pen Needle 32G X 4 MM MISC USE ONE PEN NEEDLE FOUR TIMES A DAY AS DIRECTED WITH NOVOLOG AND LANTUS PENS     NOVOLOG 100 UNIT/ML injection 76 Units. Insulin  pump  2   omega-3 acid ethyl esters (LOVAZA) 1 G capsule Take by mouth.     ONE TOUCH ULTRA TEST test strip USE FOUR TIMES A DAY AS DIRECTED  1   XIGDUO XR 06-998 MG TB24 Take 2 tablets by mouth every morning.     Continuous Blood Gluc Sensor (DEXCOM G6 SENSOR) MISC Apply topically.     Continuous Blood Gluc Transmit (DEXCOM G6 TRANSMITTER) MISC USE TO MONITOR BLOOD SUGAR. REPLACE EVERY 3 MONTHS     rosuvastatin (CRESTOR) 5 MG tablet Take 5 mg by mouth daily. (Patient not taking: Reported on 03/27/2022)     No current facility-administered medications for this visit.    PHYSICAL EXAMINATION: ECOG PERFORMANCE STATUS: 0 - Asymptomatic  BP (!) 148/66 (BP Location: Left Arm, Patient Position: Sitting)   Pulse 79   Temp (!) 97.3 F (36.3 C) (Tympanic)   Resp 18   Wt 215 lb 12.8 oz (97.9 kg)   SpO2 99%   BMI 30.10 kg/m   Filed Weights   03/27/22 1506  Weight: 215 lb 12.8 oz (97.9 kg)    Physical Exam HENT:     Head: Normocephalic and atraumatic.     Mouth/Throat:     Pharynx: No oropharyngeal exudate.  Eyes:     Pupils: Pupils are equal, round, and reactive to light.  Cardiovascular:     Rate and Rhythm: Normal rate and regular rhythm.  Pulmonary:     Effort: No respiratory distress.     Breath sounds: No wheezing.  Abdominal:     General: Bowel sounds are normal. There is no distension.     Palpations: Abdomen is soft. There is no mass.     Tenderness: There is no abdominal tenderness. There is no guarding or rebound.  Musculoskeletal:        General: No tenderness. Normal range of motion.     Cervical back: Normal range of motion and neck supple.  Skin:    General: Skin is warm.  Neurological:     Mental Status: He is  alert and oriented to person, place, and time.  Psychiatric:        Mood and Affect: Affect normal.     LABORATORY DATA:  I have reviewed the data as listed    Component Value Date/Time   NA 141 03/20/2022 0816   K 4.8 03/20/2022 0816   CL 101 03/20/2022 0816   CO2 23 03/20/2022 0816   GLUCOSE 136 (H) 03/20/2022 0816   BUN 13 03/20/2022 0816   CREATININE 0.81 03/20/2022 0816   CALCIUM 9.6 03/20/2022 0816   PROT 7.2 03/20/2022 0816   ALBUMIN 4.7 03/20/2022 0816   AST 26 03/20/2022 0816   ALT 27 03/20/2022 0816   ALKPHOS 42 (L) 03/20/2022 0816   BILITOT  2.1 (H) 03/20/2022 0816   GFRNONAA 91 04/14/2020 0848   GFRAA 106 04/14/2020 0848    No results found for: "SPEP", "UPEP"  Lab Results  Component Value Date   WBC 7.6 03/20/2022   NEUTROABS 4.8 03/20/2022   HGB 16.7 03/20/2022   HCT 48.3 03/20/2022   MCV 86 03/20/2022   PLT 150 03/20/2022      Chemistry      Component Value Date/Time   NA 141 03/20/2022 0816   K 4.8 03/20/2022 0816   CL 101 03/20/2022 0816   CO2 23 03/20/2022 0816   BUN 13 03/20/2022 0816   CREATININE 0.81 03/20/2022 0816      Component Value Date/Time   CALCIUM 9.6 03/20/2022 0816   ALKPHOS 42 (L) 03/20/2022 0816   AST 26 03/20/2022 0816   ALT 27 03/20/2022 0816   BILITOT 2.1 (H) 03/20/2022 0816       RADIOGRAPHIC STUDIES: I have personally reviewed the radiological images as listed and agreed with the findings in the report. No results found.   ASSESSMENT & PLAN:  Hereditary spherocytosis ([place]) # Hereditary spherecytosis- [previous workup at [facility]; interestingly no family history]; hemoglobin- stable. No crisis is noted.  Continue folic acid.  # Thrombocytopenia- platelets 158; splenomegaly versus others. Stable.   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   # Elevated B12 levels- recommend B12 1 pill intead of 2 pills.   # IDDM/Dr.OConnell- stable.   #Since patient is clinically stable I  think is reasonable for the patient to follow-up with PCP/can follow-up with [place] as needed.  Patient comfortable with the plan; to call us if any questions or concerns in the interim.  DISPOSITION: # Follow up as needed- Dr.B  No orders of the defined types were placed in this encounter.  All questions were answered. The patient knows to call the clinic with any problems, questions or concerns.      Cammie Sickle, MD 03/29/2022 9:30 AM

## 2022-03-27 NOTE — Progress Notes (Unsigned)
Doing well. No bruising or visible bleeding. Appetite is good. Energy is lower waxes and wanes. Wears a diabetes pump.

## 2022-05-15 ENCOUNTER — Other Ambulatory Visit: Payer: Self-pay | Admitting: *Deleted

## 2022-05-15 DIAGNOSIS — E119 Type 2 diabetes mellitus without complications: Secondary | ICD-10-CM

## 2022-05-15 DIAGNOSIS — Z794 Long term (current) use of insulin: Secondary | ICD-10-CM

## 2022-05-17 ENCOUNTER — Other Ambulatory Visit: Payer: Self-pay

## 2022-05-17 DIAGNOSIS — E119 Type 2 diabetes mellitus without complications: Secondary | ICD-10-CM

## 2022-05-17 DIAGNOSIS — Z794 Long term (current) use of insulin: Secondary | ICD-10-CM

## 2022-05-17 NOTE — Addendum Note (Signed)
Addended by: Kendell Bane on: 05/17/2022 08:57 AM   Modules accepted: Orders

## 2022-05-18 LAB — SPECIMEN STATUS REPORT

## 2022-05-23 LAB — HEMOGLOBIN A1C
Est. average glucose Bld gHb Est-mCnc: 137 mg/dL
Hgb A1c MFr Bld: 6.4 % — ABNORMAL HIGH (ref 4.8–5.6)

## 2022-07-11 ENCOUNTER — Ambulatory Visit (INDEPENDENT_AMBULATORY_CARE_PROVIDER_SITE_OTHER): Payer: Self-pay | Admitting: Adult Health

## 2022-07-11 VITALS — HR 75 | Temp 97.5°F

## 2022-07-11 DIAGNOSIS — L600 Ingrowing nail: Secondary | ICD-10-CM

## 2022-07-11 NOTE — Progress Notes (Signed)
[facility] [address] [place], [place] [address]   Office Visit Note  Patient Name: Gary Garcia. Date of Birth 10/23/2060  Medical Record number [id number]  Date of Service: 07/11/2022  Chief Complaint  Patient presents with   Toe Pain     HPI Pt is here for a sick visit. He reports he cut his toe nails about 2 weeks ago.  A few days later he noticed some tenderness/redness in the corner of his toenail.  He started getting some redness, and went to urgent care.  They gave him 10 days of Keflex.  He has taken 4 days worth at this time. Pt is Diabetic, and concerned it may be getting worse.    Current Medication:  Outpatient Encounter Medications as of 07/11/2022  Medication Sig Note   Cholecalciferol (VITAMIN D3) 1000 UNITS CAPS Take 1 capsule (1,000 Units total) by mouth 2 (two) times daily.    Continuous Blood Gluc Sensor (DEXCOM G6 SENSOR) MISC Apply topically.    Continuous Blood Gluc Transmit (DEXCOM G6 TRANSMITTER) MISC USE TO MONITOR BLOOD SUGAR. REPLACE EVERY 3 MONTHS    Cyanocobalamin (RA VITAMIN B-12 TR) 1000 MCG TBCR Take 1 tablet (1,000 mcg total) by mouth 2 (two) times daily.    folic acid (FOLVITE) 1 MG tablet TAKE 1 TABLET BY MOUTH EVERY DAY    Insulin Disposable Pump (V-GO 40) KIT USE AS DIRECTED. 01/23/2016: Received from: [facility]   Insulin Pen Needle 32G X 4 MM MISC USE ONE PEN NEEDLE FOUR TIMES A DAY AS DIRECTED WITH NOVOLOG AND LANTUS PENS 01/24/2015: Received from: [facility]   NOVOLOG 100 UNIT/ML injection 76 Units. Insulin  pump 01/24/2015: Received from: External Pharmacy   omega-3 acid ethyl esters (LOVAZA) 1 G capsule Take by mouth. 01/24/2015: Received from: [facility]   ONE TOUCH ULTRA TEST test strip USE FOUR TIMES A DAY AS DIRECTED 01/24/2015: Received from: External Pharmacy   rosuvastatin (CRESTOR) 5 MG tablet Take 5 mg by mouth daily. (Patient not taking: Reported on  03/27/2022)    XIGDUO XR 06-998 MG TB24 Take 2 tablets by mouth every morning.    No facility-administered encounter medications on file as of 07/11/2022.      Medical History: Past Medical History:  Diagnosis Date   B12 deficiency    Diabetes ([facility])    Vitamin D deficiency      Vital Signs: Pulse 75   Temp (!) 97.5 F (36.4 C) (Tympanic)   SpO2 98%    Review of Systems  Constitutional:  Negative for chills, fatigue and fever.  Skin:        Left great toe pain/redness and discharge    Physical Exam Vitals and nursing note reviewed.  Musculoskeletal:       Feet:  Feet:     Comments: Some redness   Assessment/Plan: 1. Ingrown toenail of right foot Continue to take Keflex as prescribed.  Epsom salt soaks 2-3 times daily. Follow up via MyChart messenger if symptoms fail to improve or may return to clinic as needed for worsening symptoms.       General Counseling: barnett prideaux understanding of the findings of todays visit and agrees with plan of treatment. I have discussed any further diagnostic evaluation that may be needed or ordered today. We also reviewed his medications today. he has been encouraged to call the office with any questions or concerns that should arise related to todays visit.  No orders of the defined types were placed in this encounter.   No orders of the defined types were placed in this encounter.   Time spent:15 Minutes    Johnna Acosta AGNP-C Nurse Practitioner

## 2022-10-06 ENCOUNTER — Other Ambulatory Visit: Payer: Self-pay | Admitting: Internal Medicine

## 2022-11-12 ENCOUNTER — Other Ambulatory Visit: Payer: Self-pay

## 2022-11-12 DIAGNOSIS — Z794 Long term (current) use of insulin: Secondary | ICD-10-CM

## 2022-11-12 DIAGNOSIS — E119 Type 2 diabetes mellitus without complications: Secondary | ICD-10-CM

## 2022-11-12 DIAGNOSIS — E785 Hyperlipidemia, unspecified: Secondary | ICD-10-CM

## 2022-11-12 DIAGNOSIS — E559 Vitamin D deficiency, unspecified: Secondary | ICD-10-CM

## 2022-11-14 ENCOUNTER — Other Ambulatory Visit: Payer: Self-pay

## 2022-11-14 DIAGNOSIS — E559 Vitamin D deficiency, unspecified: Secondary | ICD-10-CM

## 2022-11-14 DIAGNOSIS — E119 Type 2 diabetes mellitus without complications: Secondary | ICD-10-CM

## 2022-11-14 DIAGNOSIS — Z794 Long term (current) use of insulin: Secondary | ICD-10-CM

## 2022-11-14 DIAGNOSIS — E785 Hyperlipidemia, unspecified: Secondary | ICD-10-CM

## 2022-11-15 LAB — LIPID PANEL

## 2022-11-16 LAB — VITAMIN D 25 HYDROXY (VIT D DEFICIENCY, FRACTURES): Vit D, 25-Hydroxy: 43.6 ng/mL (ref 30.0–100.0)

## 2022-11-16 LAB — COMPREHENSIVE METABOLIC PANEL
ALT: 21 IU/L (ref 0–44)
AST: 20 IU/L (ref 0–40)
Albumin: 5.1 g/dL — ABNORMAL HIGH (ref 3.8–4.9)
Alkaline Phosphatase: 41 IU/L — ABNORMAL LOW (ref 44–121)
BUN/Creatinine Ratio: 15 (ref 10–24)
BUN: 13 mg/dL (ref 8–27)
Bilirubin Total: 1.8 mg/dL — ABNORMAL HIGH (ref 0.0–1.2)
CO2: 24 mmol/L (ref 20–29)
Calcium: 9.6 mg/dL (ref 8.6–10.2)
Chloride: 104 mmol/L (ref 96–106)
Creatinine, Ser: 0.85 mg/dL (ref 0.76–1.27)
Globulin, Total: 2 g/dL (ref 1.5–4.5)
Glucose: 126 mg/dL — ABNORMAL HIGH (ref 70–99)
Potassium: 4.7 mmol/L (ref 3.5–5.2)
Sodium: 142 mmol/L (ref 134–144)
Total Protein: 7.1 g/dL (ref 6.0–8.5)
eGFR: 99 mL/min/{1.73_m2} (ref >59)

## 2022-11-16 LAB — HEMOGLOBIN A1C
Est. average glucose Bld gHb Est-mCnc: 160 mg/dL
Hgb A1c MFr Bld: 7.2 % — ABNORMAL HIGH (ref 4.8–5.6)

## 2022-11-16 LAB — MICROALBUMIN / CREATININE URINE RATIO
Creatinine, Urine: 116.3 mg/dL
Microalb/Creat Ratio: 9 mg/g creat (ref 0–29)
Microalbumin, Urine: 10.6 ug/mL

## 2022-11-16 LAB — LIPID PANEL
Chol/HDL Ratio: 3.6 ratio (ref 0.0–5.0)
Cholesterol, Total: 112 mg/dL (ref 100–199)
HDL: 31 mg/dL — ABNORMAL LOW (ref >39)
LDL Chol Calc (NIH): 59 mg/dL (ref 0–99)
Triglycerides: 123 mg/dL (ref 0–149)
VLDL Cholesterol Cal: 22 mg/dL (ref 5–40)

## 2022-11-16 LAB — TSH: TSH: 1.95 u[IU]/mL (ref 0.450–4.500)

## 2023-01-07 ENCOUNTER — Ambulatory Visit: Payer: BC Managed Care – PPO

## 2023-01-07 DIAGNOSIS — K640 First degree hemorrhoids: Secondary | ICD-10-CM | POA: Diagnosis not present

## 2023-01-07 DIAGNOSIS — Z1211 Encounter for screening for malignant neoplasm of colon: Secondary | ICD-10-CM | POA: Diagnosis present

## 2023-01-07 DIAGNOSIS — D122 Benign neoplasm of ascending colon: Secondary | ICD-10-CM | POA: Diagnosis not present

## 2023-04-05 ENCOUNTER — Other Ambulatory Visit: Payer: Self-pay

## 2023-04-05 DIAGNOSIS — E119 Type 2 diabetes mellitus without complications: Secondary | ICD-10-CM

## 2023-04-05 DIAGNOSIS — R251 Tremor, unspecified: Secondary | ICD-10-CM

## 2023-04-05 DIAGNOSIS — E559 Vitamin D deficiency, unspecified: Secondary | ICD-10-CM

## 2023-04-05 DIAGNOSIS — R5381 Other malaise: Secondary | ICD-10-CM

## 2023-04-05 DIAGNOSIS — D696 Thrombocytopenia, unspecified: Secondary | ICD-10-CM

## 2023-04-05 DIAGNOSIS — E785 Hyperlipidemia, unspecified: Secondary | ICD-10-CM

## 2023-04-08 ENCOUNTER — Other Ambulatory Visit: Payer: Self-pay

## 2023-04-08 DIAGNOSIS — D696 Thrombocytopenia, unspecified: Secondary | ICD-10-CM

## 2023-04-08 DIAGNOSIS — R251 Tremor, unspecified: Secondary | ICD-10-CM

## 2023-04-08 DIAGNOSIS — R5381 Other malaise: Secondary | ICD-10-CM

## 2023-04-08 DIAGNOSIS — E119 Type 2 diabetes mellitus without complications: Secondary | ICD-10-CM

## 2023-04-09 ENCOUNTER — Other Ambulatory Visit: Payer: Self-pay

## 2023-04-09 DIAGNOSIS — E119 Type 2 diabetes mellitus without complications: Secondary | ICD-10-CM

## 2023-04-09 DIAGNOSIS — R251 Tremor, unspecified: Secondary | ICD-10-CM

## 2023-04-09 DIAGNOSIS — R5381 Other malaise: Secondary | ICD-10-CM

## 2023-04-09 DIAGNOSIS — D696 Thrombocytopenia, unspecified: Secondary | ICD-10-CM

## 2023-04-09 DIAGNOSIS — E559 Vitamin D deficiency, unspecified: Secondary | ICD-10-CM

## 2023-04-10 LAB — URINALYSIS, ROUTINE W REFLEX MICROSCOPIC
Bilirubin, UA: NEGATIVE
Ketones, UA: NEGATIVE
Leukocytes,UA: NEGATIVE
Nitrite, UA: NEGATIVE
Protein,UA: NEGATIVE
RBC, UA: NEGATIVE
Specific Gravity, UA: 1.023 (ref 1.005–1.030)
Urobilinogen, Ur: 1 mg/dL (ref 0.2–1.0)
pH, UA: 6 (ref 5.0–7.5)

## 2023-04-11 LAB — CBC
Hematocrit: 46.8 % (ref 37.5–51.0)
Hemoglobin: 15.7 g/dL (ref 13.0–17.7)
MCH: 29.1 pg (ref 26.6–33.0)
MCHC: 33.5 g/dL (ref 31.5–35.7)
MCV: 87 fL (ref 79–97)
Platelets: 138 10*3/uL — ABNORMAL LOW (ref 150–450)
RBC: 5.39 x10E6/uL (ref 4.14–5.80)
RDW: 13.5 % (ref 11.6–15.4)
WBC: 7.2 10*3/uL (ref 3.4–10.8)

## 2023-04-11 LAB — LACTATE DEHYDROGENASE: LDH: 149 IU/L (ref 121–224)

## 2023-04-11 LAB — COMPREHENSIVE METABOLIC PANEL
ALT: 23 IU/L (ref 0–44)
AST: 23 IU/L (ref 0–40)
Albumin: 4.7 g/dL (ref 3.9–4.9)
Alkaline Phosphatase: 43 IU/L — ABNORMAL LOW (ref 44–121)
BUN/Creatinine Ratio: 16 (ref 10–24)
BUN: 12 mg/dL (ref 8–27)
Bilirubin Total: 1.8 mg/dL — ABNORMAL HIGH (ref 0.0–1.2)
CO2: 22 mmol/L (ref 20–29)
Calcium: 9.4 mg/dL (ref 8.6–10.2)
Chloride: 103 mmol/L (ref 96–106)
Creatinine, Ser: 0.74 mg/dL — ABNORMAL LOW (ref 0.76–1.27)
Globulin, Total: 2.2 g/dL (ref 1.5–4.5)
Glucose: 125 mg/dL — ABNORMAL HIGH (ref 70–99)
Potassium: 4.3 mmol/L (ref 3.5–5.2)
Sodium: 141 mmol/L (ref 134–144)
Total Protein: 6.9 g/dL (ref 6.0–8.5)
eGFR: 103 mL/min/{1.73_m2} (ref >59)

## 2023-04-11 LAB — PSA: Prostate Specific Ag, Serum: 0.6 ng/mL (ref 0.0–4.0)

## 2023-04-11 LAB — VITAMIN D 25 HYDROXY (VIT D DEFICIENCY, FRACTURES): Vit D, 25-Hydroxy: 42.5 ng/mL (ref 30.0–100.0)

## 2023-04-11 LAB — VITAMIN B12: Vitamin B-12: 1390 pg/mL — ABNORMAL HIGH (ref 232–1245)

## 2023-05-13 ENCOUNTER — Other Ambulatory Visit: Payer: Self-pay

## 2023-05-13 DIAGNOSIS — E119 Type 2 diabetes mellitus without complications: Secondary | ICD-10-CM

## 2023-05-13 DIAGNOSIS — Z794 Long term (current) use of insulin: Secondary | ICD-10-CM

## 2023-05-14 ENCOUNTER — Other Ambulatory Visit: Payer: Self-pay | Admitting: Family Medicine

## 2023-05-14 DIAGNOSIS — E1169 Type 2 diabetes mellitus with other specified complication: Secondary | ICD-10-CM

## 2023-05-15 ENCOUNTER — Other Ambulatory Visit: Payer: Self-pay

## 2023-05-15 DIAGNOSIS — E119 Type 2 diabetes mellitus without complications: Secondary | ICD-10-CM

## 2023-05-15 DIAGNOSIS — Z794 Long term (current) use of insulin: Secondary | ICD-10-CM

## 2023-05-16 LAB — HEMOGLOBIN A1C
Est. average glucose Bld gHb Est-mCnc: 143 mg/dL
Hgb A1c MFr Bld: 6.6 % — ABNORMAL HIGH (ref 4.8–5.6)

## 2023-05-17 ENCOUNTER — Ambulatory Visit
Admission: RE | Admit: 2023-05-17 | Discharge: 2023-05-17 | Disposition: A | Discharge status: Home / Self Care | Payer: Self-pay | Source: Ambulatory Visit | Attending: Family Medicine | Admitting: Family Medicine

## 2023-05-17 DIAGNOSIS — E1169 Type 2 diabetes mellitus with other specified complication: Secondary | ICD-10-CM | POA: Insufficient documentation

## 2023-05-17 DIAGNOSIS — E785 Hyperlipidemia, unspecified: Secondary | ICD-10-CM | POA: Insufficient documentation

## 2023-11-19 ENCOUNTER — Other Ambulatory Visit: Payer: Self-pay

## 2023-11-19 DIAGNOSIS — E785 Hyperlipidemia, unspecified: Secondary | ICD-10-CM

## 2023-11-19 DIAGNOSIS — E119 Type 2 diabetes mellitus without complications: Secondary | ICD-10-CM

## 2023-11-19 DIAGNOSIS — Z794 Long term (current) use of insulin: Secondary | ICD-10-CM

## 2023-11-20 ENCOUNTER — Other Ambulatory Visit: Payer: Self-pay

## 2023-11-20 DIAGNOSIS — E119 Type 2 diabetes mellitus without complications: Secondary | ICD-10-CM

## 2023-11-20 DIAGNOSIS — E785 Hyperlipidemia, unspecified: Secondary | ICD-10-CM

## 2023-11-20 DIAGNOSIS — Z794 Long term (current) use of insulin: Secondary | ICD-10-CM

## 2023-11-21 LAB — COMPREHENSIVE METABOLIC PANEL WITH GFR
ALT: 26 IU/L (ref 0–44)
AST: 21 IU/L (ref 0–40)
Albumin: 4.8 g/dL (ref 3.9–4.9)
Alkaline Phosphatase: 41 IU/L — ABNORMAL LOW (ref 47–123)
BUN/Creatinine Ratio: 22 (ref 10–24)
BUN: 16 mg/dL (ref 8–27)
Bilirubin Total: 1.6 mg/dL — ABNORMAL HIGH (ref 0.0–1.2)
CO2: 24 mmol/L (ref 20–29)
Calcium: 9.4 mg/dL (ref 8.6–10.2)
Chloride: 102 mmol/L (ref 96–106)
Creatinine, Ser: 0.74 mg/dL — ABNORMAL LOW (ref 0.76–1.27)
Globulin, Total: 2.3 g/dL (ref 1.5–4.5)
Glucose: 154 mg/dL — ABNORMAL HIGH (ref 70–99)
Potassium: 4.9 mmol/L (ref 3.5–5.2)
Sodium: 140 mmol/L (ref 134–144)
Total Protein: 7.1 g/dL (ref 6.0–8.5)
eGFR: 103 mL/min/1.73 (ref >59)

## 2023-11-21 LAB — MICROALBUMIN / CREATININE URINE RATIO
Creatinine, Urine: 153.5 mg/dL
Microalb/Creat Ratio: 12 mg/g{creat} (ref 0–29)
Microalbumin, Urine: 18.3 ug/mL

## 2023-11-21 LAB — LIPID PANEL
Chol/HDL Ratio: 3.9 ratio (ref 0.0–5.0)
Cholesterol, Total: 121 mg/dL (ref 100–199)
HDL: 31 mg/dL — ABNORMAL LOW (ref >39)
LDL Chol Calc (NIH): 64 mg/dL (ref 0–99)
Triglycerides: 148 mg/dL (ref 0–149)
VLDL Cholesterol Cal: 26 mg/dL (ref 5–40)

## 2023-11-21 LAB — HEMOGLOBIN A1C
Est. average glucose Bld gHb Est-mCnc: 146 mg/dL
Hgb A1c MFr Bld: 6.7 % — ABNORMAL HIGH (ref 4.8–5.6)

## 2023-11-21 LAB — TSH: TSH: 2.24 u[IU]/mL (ref 0.450–4.500)

## 2023-12-09 ENCOUNTER — Other Ambulatory Visit: Payer: Self-pay | Admitting: Internal Medicine
